# Patient Record
Sex: Female | Born: 1950 | Race: White | Hispanic: No | Smoking: Never smoker
Health system: Southern US, Community
[De-identification: ages and names within clinical notes are randomized; demographics above are authoritative.]

## PROBLEM LIST (undated history)

## (undated) DIAGNOSIS — C801 Malignant (primary) neoplasm, unspecified: Secondary | ICD-10-CM

## (undated) DIAGNOSIS — K219 Gastro-esophageal reflux disease without esophagitis: Secondary | ICD-10-CM

## (undated) DIAGNOSIS — G250 Essential tremor: Secondary | ICD-10-CM

## (undated) DIAGNOSIS — R42 Dizziness and giddiness: Secondary | ICD-10-CM

## (undated) DIAGNOSIS — M1990 Unspecified osteoarthritis, unspecified site: Secondary | ICD-10-CM

## (undated) HISTORY — PX: TOTAL KNEE ARTHROPLASTY: SHX125

## (undated) HISTORY — PX: TUBAL LIGATION: SHX77

## (undated) HISTORY — PX: KIDNEY SURGERY: SHX687

## (undated) HISTORY — PX: TONSILLECTOMY AND ADENOIDECTOMY: SUR1326

## (undated) HISTORY — PX: HERNIA REPAIR: SHX51

## (undated) HISTORY — PX: ANKLE SURGERY: SHX546

## (undated) HISTORY — PX: APPENDECTOMY: SHX54

---

## 2008-02-29 ENCOUNTER — Ambulatory Visit: Payer: Self-pay

## 2010-01-01 ENCOUNTER — Ambulatory Visit: Payer: Self-pay | Admitting: Family Medicine

## 2011-03-25 ENCOUNTER — Ambulatory Visit: Payer: Self-pay | Admitting: Family Medicine

## 2011-05-11 ENCOUNTER — Ambulatory Visit: Payer: Self-pay | Admitting: Family Medicine

## 2012-03-14 ENCOUNTER — Ambulatory Visit: Payer: Self-pay | Admitting: Family Medicine

## 2012-06-08 ENCOUNTER — Ambulatory Visit: Payer: Self-pay | Admitting: Family Medicine

## 2013-02-23 ENCOUNTER — Ambulatory Visit: Payer: Self-pay | Admitting: Family Medicine

## 2013-08-01 ENCOUNTER — Ambulatory Visit: Payer: Self-pay | Admitting: Family Medicine

## 2013-08-15 ENCOUNTER — Ambulatory Visit: Payer: Self-pay | Admitting: Family Medicine

## 2014-01-20 ENCOUNTER — Emergency Department: Payer: Self-pay | Admitting: Emergency Medicine

## 2014-01-20 LAB — CBC WITH DIFFERENTIAL/PLATELET
BASOS ABS: 0.1 10*3/uL (ref 0.0–0.1)
BASOS PCT: 1.2 %
Eosinophil #: 0.4 10*3/uL (ref 0.0–0.7)
Eosinophil %: 4.5 %
HCT: 36.8 % (ref 35.0–47.0)
HGB: 12.2 g/dL (ref 12.0–16.0)
LYMPHS ABS: 2.7 10*3/uL (ref 1.0–3.6)
LYMPHS PCT: 31.5 %
MCH: 28.3 pg (ref 26.0–34.0)
MCHC: 33.1 g/dL (ref 32.0–36.0)
MCV: 85 fL (ref 80–100)
Monocyte #: 0.7 x10 3/mm (ref 0.2–0.9)
Monocyte %: 7.9 %
NEUTROS ABS: 4.7 10*3/uL (ref 1.4–6.5)
Neutrophil %: 54.9 %
Platelet: 274 10*3/uL (ref 150–440)
RBC: 4.31 10*6/uL (ref 3.80–5.20)
RDW: 14.4 % (ref 11.5–14.5)
WBC: 8.6 10*3/uL (ref 3.6–11.0)

## 2014-01-21 LAB — BASIC METABOLIC PANEL
ANION GAP: 7 (ref 7–16)
BUN: 17 mg/dL (ref 7–18)
CREATININE: 1.24 mg/dL (ref 0.60–1.30)
Calcium, Total: 9.2 mg/dL (ref 8.5–10.1)
Chloride: 105 mmol/L (ref 98–107)
Co2: 27 mmol/L (ref 21–32)
EGFR (African American): 54 — ABNORMAL LOW
EGFR (Non-African Amer.): 46 — ABNORMAL LOW
GLUCOSE: 102 mg/dL — AB (ref 65–99)
OSMOLALITY: 279 (ref 275–301)
POTASSIUM: 3.3 mmol/L — AB (ref 3.5–5.1)
Sodium: 139 mmol/L (ref 136–145)

## 2014-01-25 LAB — CULTURE, BLOOD (SINGLE)

## 2014-03-20 ENCOUNTER — Ambulatory Visit: Payer: Self-pay | Admitting: Family Medicine

## 2014-03-27 ENCOUNTER — Ambulatory Visit: Payer: Self-pay | Admitting: Family Medicine

## 2014-11-28 LAB — BASIC METABOLIC PANEL
BUN: 17 mg/dL (ref 4–21)
CREATININE: 1 mg/dL (ref 0.5–1.1)
Glucose: 102 mg/dL
POTASSIUM: 4.4 mmol/L (ref 3.4–5.3)
Sodium: 140 mmol/L (ref 137–147)

## 2014-11-28 LAB — CBC AND DIFFERENTIAL
HCT: 41 % (ref 36–46)
HEMOGLOBIN: 13.7 g/dL (ref 12.0–16.0)
Neutrophils Absolute: 4 /uL
Platelets: 338 10*3/uL (ref 150–399)
WBC: 7 10*3/mL

## 2014-11-28 LAB — HEPATIC FUNCTION PANEL
ALK PHOS: 97 U/L (ref 25–125)
ALT: 21 U/L (ref 7–35)
AST: 30 U/L (ref 13–35)
Bilirubin, Total: 0.3 mg/dL

## 2014-11-28 LAB — HM HEPATITIS C SCREENING LAB: HM Hepatitis Screen: NEGATIVE

## 2014-11-28 LAB — LIPID PANEL
Cholesterol: 213 mg/dL — AB (ref 0–200)
HDL: 69 mg/dL (ref 35–70)
LDL Cholesterol: 116 mg/dL
LDl/HDL Ratio: 1.7
Triglycerides: 140 mg/dL (ref 40–160)

## 2014-11-28 LAB — TSH: TSH: 1.18 u[IU]/mL (ref 0.41–5.90)

## 2015-03-30 ENCOUNTER — Other Ambulatory Visit: Payer: Self-pay | Admitting: Family Medicine

## 2015-04-15 ENCOUNTER — Other Ambulatory Visit: Payer: Self-pay | Admitting: Family Medicine

## 2015-05-13 ENCOUNTER — Telehealth: Payer: Self-pay | Admitting: Family Medicine

## 2015-05-13 DIAGNOSIS — R922 Inconclusive mammogram: Secondary | ICD-10-CM

## 2015-05-13 DIAGNOSIS — Z1239 Encounter for other screening for malignant neoplasm of breast: Secondary | ICD-10-CM

## 2015-05-13 NOTE — Telephone Encounter (Signed)
Pt tried to schedule her mammagram at [facility] and they told her her primary care doctor would have to order it.  Can we make her an appt and call her back?  Her call back is  cell [phone]  Thanks Con Memos

## 2015-05-16 ENCOUNTER — Telehealth: Payer: Self-pay | Admitting: Family Medicine

## 2015-05-16 DIAGNOSIS — R928 Other abnormal and inconclusive findings on diagnostic imaging of breast: Secondary | ICD-10-CM

## 2015-05-16 NOTE — Telephone Encounter (Signed)
Done-aa 

## 2015-05-16 NOTE — Telephone Encounter (Signed)
Per [facility] pt needs diagnostic bilateral mammogram along with right/left breast ultrasound.It was put in as a uni R,Thanks

## 2015-05-27 ENCOUNTER — Other Ambulatory Visit: Payer: Self-pay | Admitting: Family Medicine

## 2015-05-27 ENCOUNTER — Ambulatory Visit: Payer: Self-pay

## 2015-05-27 ENCOUNTER — Ambulatory Visit
Admission: RE | Admit: 2015-05-27 | Discharge: 2015-05-27 | Disposition: A | Discharge status: Home / Self Care | Payer: PRIVATE HEALTH INSURANCE | Source: Ambulatory Visit | Attending: Family Medicine | Admitting: Family Medicine

## 2015-05-27 ENCOUNTER — Ambulatory Visit: Admission: RE | Admit: 2015-05-27 | Payer: Self-pay | Source: Ambulatory Visit

## 2015-05-27 DIAGNOSIS — R928 Other abnormal and inconclusive findings on diagnostic imaging of breast: Secondary | ICD-10-CM

## 2015-05-27 DIAGNOSIS — R921 Mammographic calcification found on diagnostic imaging of breast: Secondary | ICD-10-CM

## 2015-05-27 HISTORY — DX: Malignant (primary) neoplasm, unspecified: C80.1

## 2015-05-28 ENCOUNTER — Encounter: Payer: Self-pay | Admitting: Family Medicine

## 2015-06-04 ENCOUNTER — Ambulatory Visit
Admission: RE | Admit: 2015-06-04 | Discharge: 2015-06-04 | Disposition: A | Discharge status: Home / Self Care | Payer: PRIVATE HEALTH INSURANCE | Source: Ambulatory Visit | Attending: Family Medicine | Admitting: Family Medicine

## 2015-06-04 DIAGNOSIS — R921 Mammographic calcification found on diagnostic imaging of breast: Secondary | ICD-10-CM | POA: Insufficient documentation

## 2015-06-04 HISTORY — PX: BREAST BIOPSY: SHX20

## 2015-06-05 LAB — SURGICAL PATHOLOGY

## 2015-08-29 NOTE — Addendum Note (Signed)
Encounter addended by: Wayne Both on: 08/29/2015  1:32 PM<BR>     Documentation filed: Charges VN

## 2015-12-26 DIAGNOSIS — K219 Gastro-esophageal reflux disease without esophagitis: Secondary | ICD-10-CM | POA: Insufficient documentation

## 2015-12-26 DIAGNOSIS — C649 Malignant neoplasm of unspecified kidney, except renal pelvis: Secondary | ICD-10-CM | POA: Insufficient documentation

## 2015-12-26 DIAGNOSIS — N289 Disorder of kidney and ureter, unspecified: Secondary | ICD-10-CM | POA: Insufficient documentation

## 2015-12-26 DIAGNOSIS — I10 Essential (primary) hypertension: Secondary | ICD-10-CM | POA: Insufficient documentation

## 2015-12-26 DIAGNOSIS — G4700 Insomnia, unspecified: Secondary | ICD-10-CM | POA: Insufficient documentation

## 2015-12-26 DIAGNOSIS — L720 Epidermal cyst: Secondary | ICD-10-CM | POA: Insufficient documentation

## 2015-12-26 DIAGNOSIS — T148XXA Other injury of unspecified body region, initial encounter: Secondary | ICD-10-CM | POA: Insufficient documentation

## 2015-12-26 DIAGNOSIS — I459 Conduction disorder, unspecified: Secondary | ICD-10-CM | POA: Insufficient documentation

## 2015-12-26 DIAGNOSIS — E669 Obesity, unspecified: Secondary | ICD-10-CM | POA: Insufficient documentation

## 2015-12-31 ENCOUNTER — Ambulatory Visit (INDEPENDENT_AMBULATORY_CARE_PROVIDER_SITE_OTHER): Payer: 59 | Admitting: Family Medicine

## 2015-12-31 ENCOUNTER — Encounter: Payer: Self-pay | Admitting: Family Medicine

## 2015-12-31 VITALS — BP 160/88 | HR 68 | Temp 98.1°F | Resp 16 | Ht 69.0 in | Wt 229.0 lb

## 2015-12-31 DIAGNOSIS — IMO0001 Reserved for inherently not codable concepts without codable children: Secondary | ICD-10-CM

## 2015-12-31 DIAGNOSIS — R030 Elevated blood-pressure reading, without diagnosis of hypertension: Secondary | ICD-10-CM

## 2015-12-31 DIAGNOSIS — Z0000 Encounter for general adult medical examination without abnormal findings: Secondary | ICD-10-CM

## 2015-12-31 DIAGNOSIS — Z23 Encounter for immunization: Secondary | ICD-10-CM

## 2015-12-31 DIAGNOSIS — Z1211 Encounter for screening for malignant neoplasm of colon: Secondary | ICD-10-CM

## 2015-12-31 LAB — POCT URINALYSIS DIPSTICK
Bilirubin, UA: NEGATIVE
Glucose, UA: NEGATIVE
KETONES UA: NEGATIVE
Leukocytes, UA: NEGATIVE
Nitrite, UA: NEGATIVE
PH UA: 6
PROTEIN UA: NEGATIVE
RBC UA: NEGATIVE
SPEC GRAV UA: 1.01
UROBILINOGEN UA: NEGATIVE

## 2015-12-31 NOTE — Progress Notes (Signed)
Patient ID: Jaime Ramirez, female   DOB: [birth year], 65 y.o.   MRN: [id number] Patient: Jaime Ramirez, Female    DOB: [birth year], 65 y.o.   MRN: [id number] Visit Date: 12/31/2015  Today's Provider: [name], MD   Chief Complaint  Patient presents with  . Annual Exam   Subjective:  Jaime Ramirez is a 65 y.o. female who presents today for health maintenance and complete physical. She feels well. She reports exercising 5 days weekly. She reports she is sleeping well.   Colonoscopy-patient states she has had 2 since the age of 66 but none in the last 10 years.  Patient would like to have this scheduled before the end of June.  06/07/15 Mammogram 06/29/12 Pap smear  Immunization History  Administered Date(s) Administered  . Tdap 03/20/2014  . Zoster 11/28/2014     Review of Systems  Constitutional: Negative.   HENT: Negative.   Eyes: Negative.   Respiratory: Negative.   Cardiovascular: Negative.   Gastrointestinal: Negative.   Endocrine: Negative.   Genitourinary: Negative.   Musculoskeletal: Positive for arthralgias.  Skin: Negative.   Allergic/Immunologic: Negative.   Neurological: Positive for tremors.  Hematological: Negative.   Psychiatric/Behavioral: Negative.     Social History   Social History  . Marital Status: Divorced    Spouse Name: N/A  . Number of Children: N/A  . Years of Education: N/A   Occupational History  . Not on file.   Social History Main Topics  . Smoking status: Never Smoker   . Smokeless tobacco: Not on file  . Alcohol Use: 0.0 oz/week    0 Standard drinks or equivalent per week     Comment: 2 per week  . Drug Use: No  . Sexual Activity: Not on file   Other Topics Concern  . Not on file   Social History Narrative    Patient Active Problem List   Diagnosis Date Noted  . 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 Acid reflux 12/26/2015  . BP (high blood pressure) 12/26/2015  . Cannot sleep 12/26/2015  . Hematoma  12/26/2015  . Disorder of kidney and ureter 12/26/2015  . Adiposity 12/26/2015  . Adenocarcinoma, renal cell ([place]) 12/26/2015  . Atheroma, skin 12/26/2015    Past Surgical History  Procedure Laterality Date  . Hernia repair    . Kidney surgery      kidney removal  . Ankle surgery    . Tubal ligation    . Appendectomy    . Tonsillectomy and adenoidectomy    . Total knee arthroplasty      Her family history includes Atrial fibrillation in her mother; Brain cancer in her paternal aunt; Breast cancer in her maternal aunt and paternal aunt; Cancer in her maternal grandfather; Heart disease in her mother and sister; Hypertension in her father; Kidney disease in her father; Lupus in her sister; Stroke in her maternal grandmother and sister.    Outpatient Prescriptions Prior to Visit  Medication Sig Dispense Refill  . aspirin 81 MG tablet Take by mouth.    . hydrochlorothiazide (HYDRODIURIL) 25 MG tablet TAKE 1 TABLET BY MOUTH EVERY MORNING 30 tablet 12  . Naproxen Sod-Diphenhydramine (ALEVE PM) 220-25 MG TABS Take by mouth.    . pantoprazole (PROTONIX) 40 MG tablet TAKE 1 TABLET BY MOUTH TWICE DAILY 60 tablet 12  . ranitidine (ZANTAC) 300 MG tablet TAKE 1 TABLET BY MOUTH EVERY DAY 30 tablet 12   No facility-administered medications prior to visit.  Patient Care Team: Jerrol Banana., MD as PCP - General (Family Medicine)     Objective:   Vitals:  Filed Vitals:   12/31/15 1040  BP: 160/88  Pulse: 68  Temp: 98.1 F (36.7 C)  TempSrc: Oral  Resp: 16  Height: 5\' 9"  (1.753 m)  Weight: 229 lb (103.874 kg)    Physical Exam  Constitutional: She is oriented to person, place, and time. She appears well-developed and well-nourished.  HENT:  Head: Normocephalic and atraumatic.  Right Ear: External ear normal.  Left Ear: External ear normal.  Nose: Nose normal.  Mouth/Throat: Oropharynx is clear and moist.  Eyes: Conjunctivae and EOM are normal. Pupils are equal, round,  and reactive to light.  Neck: Normal range of motion. Neck supple.  Cardiovascular: Normal rate, regular rhythm, normal heart sounds and intact distal pulses.   Pulmonary/Chest: Effort normal and breath sounds normal.  Abdominal: Soft. Bowel sounds are normal.  Musculoskeletal: Normal range of motion.  Neurological: She is alert and oriented to person, place, and time.  Skin: Skin is warm and dry.  Psychiatric: She has a normal mood and affect. Her behavior is normal. Judgment and thought content normal.     Depression Screen PHQ 2/9 Scores 12/31/2015  PHQ - 2 Score 0      Assessment & Plan:     Routine Health Maintenance and Physical Exam  1. Annual physical exam  - CBC with Differential/Platelet - Comprehensive metabolic panel - Lipid Panel With LDL/HDL Ratio - TSH - POCT urinalysis dipstick  2. Need for pneumococcal vaccine  - Pneumococcal conjugate vaccine 13-valent IM  3. Colon cancer screening  - Ambulatory referral to Gastroenterology  4. Elevated BP Elevated reading in the office today.  She states it has been elevated 2 weeks ago when she donated blood.  Will allow her to work on habits.  Have discussed starting her on something if it is still elevated on follow up visit.   Exercise Activities and Dietary recommendations Goals    None      Immunization History  Administered Date(s) Administered  . Tdap 03/20/2014  . Zoster 11/28/2014    Health Maintenance  Topic Date Due  . Hepatitis C Screening  May 17, 1951  . HIV Screening  12/18/1965  . PAP SMEAR  12/19/1971  . COLONOSCOPY  12/18/2000  . DEXA SCAN  12/19/2015  . PNA vac Low Risk Adult (1 of 2 - PCV13) 12/19/2015  . INFLUENZA VACCINE  03/24/2016  . MAMMOGRAM  06/03/2017  . TETANUS/TDAP  03/20/2024  . ZOSTAVAX  Completed      Discussed health benefits of physical activity, and encouraged her to engage in regular exercise appropriate for her age and condition.   I have done the exam and  reviewed the above chart and it is accurate to the best of my knowledge.  ------------------------------------------------------------------------------------------------------------

## 2016-01-01 LAB — CBC WITH DIFFERENTIAL/PLATELET
BASOS ABS: 0.1 10*3/uL (ref 0.0–0.2)
Basos: 1 %
EOS (ABSOLUTE): 0.2 10*3/uL (ref 0.0–0.4)
Eos: 4 %
HEMOGLOBIN: 12.5 g/dL (ref 11.1–15.9)
Hematocrit: 38 % (ref 34.0–46.6)
Immature Grans (Abs): 0 10*3/uL (ref 0.0–0.1)
Immature Granulocytes: 0 %
LYMPHS ABS: 1.9 10*3/uL (ref 0.7–3.1)
Lymphs: 28 %
MCH: 28.2 pg (ref 26.6–33.0)
MCHC: 32.9 g/dL (ref 31.5–35.7)
MCV: 86 fL (ref 79–97)
MONOS ABS: 0.6 10*3/uL (ref 0.1–0.9)
Monocytes: 9 %
NEUTROS ABS: 4 10*3/uL (ref 1.4–7.0)
Neutrophils: 58 %
PLATELETS: 315 10*3/uL (ref 150–379)
RBC: 4.44 x10E6/uL (ref 3.77–5.28)
RDW: 14.4 % (ref 12.3–15.4)
WBC: 6.8 10*3/uL (ref 3.4–10.8)

## 2016-01-01 LAB — LIPID PANEL WITH LDL/HDL RATIO
CHOLESTEROL TOTAL: 183 mg/dL (ref 100–199)
HDL: 69 mg/dL (ref >39)
LDL Calculated: 91 mg/dL (ref 0–99)
LDl/HDL Ratio: 1.3 ratio units (ref 0.0–3.2)
Triglycerides: 115 mg/dL (ref 0–149)
VLDL CHOLESTEROL CAL: 23 mg/dL (ref 5–40)

## 2016-01-01 LAB — COMPREHENSIVE METABOLIC PANEL
ALBUMIN: 4.2 g/dL (ref 3.6–4.8)
ALK PHOS: 93 IU/L (ref 39–117)
ALT: 12 IU/L (ref 0–32)
AST: 21 IU/L (ref 0–40)
Albumin/Globulin Ratio: 1.5 (ref 1.2–2.2)
BILIRUBIN TOTAL: 0.3 mg/dL (ref 0.0–1.2)
BUN / CREAT RATIO: 14 (ref 12–28)
BUN: 15 mg/dL (ref 8–27)
CHLORIDE: 101 mmol/L (ref 96–106)
CO2: 22 mmol/L (ref 18–29)
Calcium: 9.6 mg/dL (ref 8.7–10.3)
Creatinine, Ser: 1.04 mg/dL — ABNORMAL HIGH (ref 0.57–1.00)
GFR calc Af Amer: 65 mL/min/{1.73_m2} (ref >59)
GFR calc non Af Amer: 57 mL/min/{1.73_m2} — ABNORMAL LOW (ref >59)
GLUCOSE: 94 mg/dL (ref 65–99)
Globulin, Total: 2.8 g/dL (ref 1.5–4.5)
Potassium: 4.1 mmol/L (ref 3.5–5.2)
Sodium: 143 mmol/L (ref 134–144)
Total Protein: 7 g/dL (ref 6.0–8.5)

## 2016-01-01 LAB — TSH: TSH: 1.01 u[IU]/mL (ref 0.450–4.500)

## 2016-01-15 ENCOUNTER — Other Ambulatory Visit: Payer: Self-pay

## 2016-01-15 ENCOUNTER — Telehealth: Payer: Self-pay

## 2016-01-15 NOTE — Telephone Encounter (Signed)
Gastroenterology Pre-Procedure Review  Request Date: 02/21/16 Requesting Physician: Dr. Rosanna Randy  PATIENT REVIEW QUESTIONS: The patient responded to the following health history questions as indicated:    1. Are you having any GI issues? no 2. Do you have a personal history of Polyps? no 3. Do you have a family history of Colon Cancer or Polyps? no 4. Diabetes Mellitus? no 5. Joint replacements in the past 12 months?no 6. Major health problems in the past 3 months?no 7. Any artificial heart valves, MVP, or defibrillator?no    MEDICATIONS & ALLERGIES:    Patient reports the following regarding taking any anticoagulation/antiplatelet therapy:   Plavix, Coumadin, Eliquis, Xarelto, Lovenox, Pradaxa, Brilinta, or Effient? no Aspirin? yes (ASA 81mg )  Patient confirms/reports the following medications:  Current Outpatient Prescriptions  Medication Sig Dispense Refill  . aspirin 81 MG tablet Take by mouth.    . hydrochlorothiazide (HYDRODIURIL) 25 MG tablet TAKE 1 TABLET BY MOUTH EVERY MORNING 30 tablet 12  . Naproxen Sod-Diphenhydramine (ALEVE PM) 220-25 MG TABS Take by mouth.    . pantoprazole (PROTONIX) 40 MG tablet TAKE 1 TABLET BY MOUTH TWICE DAILY 60 tablet 12  . ranitidine (ZANTAC) 300 MG tablet TAKE 1 TABLET BY MOUTH EVERY DAY 30 tablet 12   No current facility-administered medications for this visit.    Patient confirms/reports the following allergies:  Allergies  Allergen Reactions  . Codeine   . Hydromorphone Hives    No orders of the defined types were placed in this encounter.    AUTHORIZATION INFORMATION Primary Insurance: 1D#: Group #:  Secondary Insurance: 1D#: Group #:  SCHEDULE INFORMATION: Date: 02/21/16 Time: Location: [place]

## 2016-02-19 NOTE — Discharge Instructions (Signed)

## 2016-02-21 ENCOUNTER — Ambulatory Visit: Payer: PRIVATE HEALTH INSURANCE | Admitting: Anesthesiology

## 2016-02-21 ENCOUNTER — Encounter
Admission: RE | Disposition: A | Discharge status: Home / Self Care | Payer: Self-pay | Source: Ambulatory Visit | Attending: Gastroenterology

## 2016-02-21 ENCOUNTER — Ambulatory Visit
Admission: RE | Admit: 2016-02-21 | Discharge: 2016-02-21 | Disposition: A | Discharge status: Home / Self Care | Payer: PRIVATE HEALTH INSURANCE | Source: Ambulatory Visit | Attending: Gastroenterology | Admitting: Gastroenterology

## 2016-02-21 DIAGNOSIS — K219 Gastro-esophageal reflux disease without esophagitis: Secondary | ICD-10-CM | POA: Insufficient documentation

## 2016-02-21 DIAGNOSIS — K633 Ulcer of intestine: Secondary | ICD-10-CM | POA: Diagnosis not present

## 2016-02-21 DIAGNOSIS — Z808 Family history of malignant neoplasm of other organs or systems: Secondary | ICD-10-CM | POA: Diagnosis not present

## 2016-02-21 DIAGNOSIS — Z803 Family history of malignant neoplasm of breast: Secondary | ICD-10-CM | POA: Insufficient documentation

## 2016-02-21 DIAGNOSIS — Z7982 Long term (current) use of aspirin: Secondary | ICD-10-CM | POA: Insufficient documentation

## 2016-02-21 DIAGNOSIS — Z809 Family history of malignant neoplasm, unspecified: Secondary | ICD-10-CM | POA: Insufficient documentation

## 2016-02-21 DIAGNOSIS — Z79899 Other long term (current) drug therapy: Secondary | ICD-10-CM | POA: Insufficient documentation

## 2016-02-21 DIAGNOSIS — Z85528 Personal history of other malignant neoplasm of kidney: Secondary | ICD-10-CM | POA: Diagnosis not present

## 2016-02-21 DIAGNOSIS — K5730 Diverticulosis of large intestine without perforation or abscess without bleeding: Secondary | ICD-10-CM | POA: Insufficient documentation

## 2016-02-21 DIAGNOSIS — M1990 Unspecified osteoarthritis, unspecified site: Secondary | ICD-10-CM | POA: Diagnosis not present

## 2016-02-21 DIAGNOSIS — Z840 Family history of diseases of the skin and subcutaneous tissue: Secondary | ICD-10-CM | POA: Diagnosis not present

## 2016-02-21 DIAGNOSIS — Z888 Allergy status to other drugs, medicaments and biological substances status: Secondary | ICD-10-CM | POA: Insufficient documentation

## 2016-02-21 DIAGNOSIS — K641 Second degree hemorrhoids: Secondary | ICD-10-CM | POA: Diagnosis not present

## 2016-02-21 DIAGNOSIS — Z1211 Encounter for screening for malignant neoplasm of colon: Secondary | ICD-10-CM | POA: Diagnosis not present

## 2016-02-21 DIAGNOSIS — Z8249 Family history of ischemic heart disease and other diseases of the circulatory system: Secondary | ICD-10-CM | POA: Diagnosis not present

## 2016-02-21 DIAGNOSIS — Z96653 Presence of artificial knee joint, bilateral: Secondary | ICD-10-CM | POA: Diagnosis not present

## 2016-02-21 DIAGNOSIS — Z885 Allergy status to narcotic agent status: Secondary | ICD-10-CM | POA: Insufficient documentation

## 2016-02-21 DIAGNOSIS — K529 Noninfective gastroenteritis and colitis, unspecified: Secondary | ICD-10-CM | POA: Insufficient documentation

## 2016-02-21 DIAGNOSIS — Z823 Family history of stroke: Secondary | ICD-10-CM | POA: Diagnosis not present

## 2016-02-21 HISTORY — DX: Unspecified osteoarthritis, unspecified site: M19.90

## 2016-02-21 HISTORY — PX: COLONOSCOPY WITH PROPOFOL: SHX5780

## 2016-02-21 HISTORY — DX: Gastro-esophageal reflux disease without esophagitis: K21.9

## 2016-02-21 SURGERY — COLONOSCOPY WITH PROPOFOL
Anesthesia: Monitor Anesthesia Care | Wound class: Contaminated

## 2016-02-21 MED ORDER — STERILE WATER FOR IRRIGATION IR SOLN
Status: DC | PRN
  Administered 2016-02-21: 09:00:00

## 2016-02-21 MED ORDER — LACTATED RINGERS IV SOLN
INTRAVENOUS | Status: DC
  Administered 2016-02-21: 08:00:00 via INTRAVENOUS

## 2016-02-21 MED ORDER — LIDOCAINE HCL (CARDIAC) 20 MG/ML IV SOLN
INTRAVENOUS | Status: DC | PRN
  Administered 2016-02-21: 40 mg via INTRAVENOUS

## 2016-02-21 MED ORDER — ACETAMINOPHEN 160 MG/5ML PO SOLN: 325.0000 mg | ORAL | Status: DC | PRN

## 2016-02-21 MED ORDER — PROPOFOL 10 MG/ML IV BOLUS
INTRAVENOUS | Status: DC | PRN
  Administered 2016-02-21 (×3): 50 mg via INTRAVENOUS
  Administered 2016-02-21: 40 mg via INTRAVENOUS

## 2016-02-21 MED ORDER — ACETAMINOPHEN 325 MG PO TABS: 325.0000 mg | ORAL_TABLET | ORAL | Status: DC | PRN

## 2016-02-21 SURGICAL SUPPLY — 23 items
CANISTER SUCT 1200ML W/VALVE (MISCELLANEOUS) ×2 IMPLANT
CLIP HMST 235XBRD CATH ROT (MISCELLANEOUS) IMPLANT
CLIP RESOLUTION 360 11X235 (MISCELLANEOUS)
FCP ESCP3.2XJMB 240X2.8X (MISCELLANEOUS)
FORCEPS BIOP RAD 4 LRG CAP 4 (CUTTING FORCEPS) ×2 IMPLANT
FORCEPS BIOP RJ4 240 W/NDL (MISCELLANEOUS)
FORCEPS ESCP3.2XJMB 240X2.8X (MISCELLANEOUS) IMPLANT
GOWN CVR UNV OPN BCK APRN NK (MISCELLANEOUS) ×2 IMPLANT
GOWN ISOL THUMB LOOP REG UNIV (MISCELLANEOUS) ×2
INJECTOR VARIJECT VIN23 (MISCELLANEOUS) IMPLANT
KIT DEFENDO VALVE AND CONN (KITS) IMPLANT
KIT ENDO PROCEDURE OLY (KITS) ×2 IMPLANT
MARKER SPOT ENDO TATTOO 5ML (MISCELLANEOUS) IMPLANT
PAD GROUND ADULT SPLIT (MISCELLANEOUS) IMPLANT
PROBE APC STR FIRE (PROBE) IMPLANT
RETRIEVER NET ROTH 2.5X230 LF (MISCELLANEOUS) ×2 IMPLANT
SNARE SHORT THROW 13M SML OVAL (MISCELLANEOUS) IMPLANT
SNARE SHORT THROW 30M LRG OVAL (MISCELLANEOUS) IMPLANT
SNARE SNG USE RND 15MM (INSTRUMENTS) IMPLANT
SPOT EX ENDOSCOPIC TATTOO (MISCELLANEOUS)
TRAP ETRAP POLY (MISCELLANEOUS) IMPLANT
VARIJECT INJECTOR VIN23 (MISCELLANEOUS)
WATER STERILE IRR 250ML POUR (IV SOLUTION) ×2 IMPLANT

## 2016-02-21 NOTE — Transfer of Care (Signed)
Immediate Anesthesia Transfer of Care Note  Patient: Jaime Ramirez  Procedure(s) Performed: Procedure(s): COLONOSCOPY WITH PROPOFOL (N/A)  Patient Location: PACU  Anesthesia Type: MAC  Level of Consciousness: awake, alert  and patient cooperative  Airway and Oxygen Therapy: Patient Spontanous Breathing and Patient connected to supplemental oxygen  Post-op Assessment: Post-op Vital signs reviewed, Patient's Cardiovascular Status Stable, Respiratory Function Stable, Patent Airway and No signs of Nausea or vomiting  Post-op Vital Signs: Reviewed and stable  Complications: No apparent anesthesia complications

## 2016-02-21 NOTE — Anesthesia Postprocedure Evaluation (Signed)
Anesthesia Post Note  Patient: Jaime Ramirez  Procedure(s) Performed: Procedure(s) (LRB): COLONOSCOPY WITH PROPOFOL (N/A)  Patient location during evaluation: PACU Anesthesia Type: MAC Level of consciousness: awake and alert and oriented Pain management: satisfactory to patient Vital Signs Assessment: post-procedure vital signs reviewed and stable Respiratory status: spontaneous breathing, nonlabored ventilation and respiratory function stable Cardiovascular status: blood pressure returned to baseline and stable Postop Assessment: Adequate PO intake and No signs of nausea or vomiting Anesthetic complications: no    Raliegh Ip

## 2016-02-21 NOTE — Op Note (Signed)
[facility] Gastroenterology Patient Name: Jaime Ramirez Procedure Date: 02/21/2016 8:59 AM MRN: [id number] Account #: [id number] Date of Birth: 08/11/1951 Admit Type: Outpatient Age: 65 Room: [facility] OR ROOM 01 Gender: Female Note Status: Finalized Procedure:            Colonoscopy Indications:          Screening for colorectal malignant neoplasm Providers:            Lucilla Lame, MD Referring MD:         Janine Ores. Rosanna Randy, MD (Referring MD) Medicines:            Propofol per Anesthesia Complications:        No immediate complications. Procedure:            Pre-Anesthesia Assessment:                       - Prior to the procedure, a History and Physical was                        performed, and patient medications and allergies were                        reviewed. The patient's tolerance of previous                        anesthesia was also reviewed. The risks and benefits of                        the procedure and the sedation options and risks were                        discussed with the patient. All questions were                        answered, and informed consent was obtained. Prior                        Anticoagulants: The patient has taken no previous                        anticoagulant or antiplatelet agents. ASA Grade                        Assessment: II - A patient with mild systemic disease.                        After reviewing the risks and benefits, the patient was                        deemed in satisfactory condition to undergo the                        procedure.                       After obtaining informed consent, the colonoscope was                        passed under direct vision. Throughout the procedure,  the patient's blood pressure, pulse, and oxygen                        saturations were monitored continuously. The Olympus CF                        H180AL colonoscope (S#: [id number]) was introduced  through                        the anus and advanced to the the cecum, identified by                        appendiceal orifice and ileocecal valve. The                        colonoscopy was performed without difficulty. The                        patient tolerated the procedure well. The quality of                        the bowel preparation was excellent. Findings:      The perianal and digital rectal examinations were normal.      A single small-mouthed diverticulum was found in the sigmoid colon.      Non-bleeding internal hemorrhoids were found during retroflexion. The       hemorrhoids were Grade II (internal hemorrhoids that prolapse but reduce       spontaneously).      Nonbleeding ulcerated mucosa were present in the sigmoid colon. Biopsies       were taken with a cold forceps for histology. Impression:           - Diverticulosis in the sigmoid colon.                       - Non-bleeding internal hemorrhoids.                       - Mucosal ulceration. Biopsied. Recommendation:       - Await pathology results. Procedure Code(s):    --- Professional ---                       [phone], Colonoscopy, flexible; with biopsy, single or                        multiple Diagnosis Code(s):    --- Professional ---                       Z12.11, Encounter for screening for malignant neoplasm                        of colon                       K63.3, Ulcer of intestine CPT copyright 2016 American Medical Association. All rights reserved. The codes documented in this report are preliminary and upon coder review may  be revised to meet current compliance requirements. Lucilla Lame, MD 02/21/2016 9:21:56 AM This report has been signed electronically. Number of Addenda: 0 Note Initiated On: 02/21/2016 8:59 AM Scope Withdrawal Time: 0 hours 6 minutes  18 seconds  Total Procedure Duration: 0 hours 10 minutes 35 seconds       [facility]

## 2016-02-21 NOTE — Anesthesia Preprocedure Evaluation (Signed)
Anesthesia Evaluation  Patient identified by MRN, date of birth, ID band  Reviewed: Allergy & Precautions, H&P , NPO status , Patient's Chart, lab work & pertinent test results  Airway Mallampati: II  TM Distance: >3 FB Neck ROM: full    Dental no notable dental hx.    Pulmonary    Pulmonary exam normal       Cardiovascular hypertension, Rhythm:regular Rate:Normal     Neuro/Psych    GI/Hepatic GERD-  ,  Endo/Other    Renal/GU      Musculoskeletal   Abdominal   Peds  Hematology   Anesthesia Other Findings   Reproductive/Obstetrics                             Anesthesia Physical Anesthesia Plan  ASA: II  Anesthesia Plan: MAC   Post-op Pain Management:    Induction:   Airway Management Planned:   Additional Equipment:   Intra-op Plan:   Post-operative Plan:   Informed Consent: I have reviewed the patients History and Physical, chart, labs and discussed the procedure including the risks, benefits and alternatives for the proposed anesthesia with the patient or authorized representative who has indicated his/her understanding and acceptance.     Plan Discussed with: CRNA  Anesthesia Plan Comments:         Anesthesia Quick Evaluation  

## 2016-02-21 NOTE — H&P (Signed)
Lucilla Lame, MD [address] [place], [place] [address] Phone: [phone] Fax : [phone]  Primary Care Physician:  [name], MD Primary Gastroenterologist:  Dr. Allen Norris  Pre-Procedure History & Physical: HPI:  Jaime Ramirez is a 65 y.o. female is here for a screening colonoscopy.   Past Medical History  Diagnosis Date  . Cancer ([place])     kidney  . Arthritis   . GERD (gastroesophageal reflux disease)     Past Surgical History  Procedure Laterality Date  . Hernia repair    . Kidney surgery Left     kidney removal  . Ankle surgery    . Tubal ligation    . Appendectomy    . Tonsillectomy and adenoidectomy    . Total knee arthroplasty Bilateral     Prior to Admission medications   Medication Sig Start Date End Date Taking? Authorizing Provider  aspirin 81 MG tablet Take by mouth.   Yes Historical Provider, MD  hydrochlorothiazide (HYDRODIURIL) 25 MG tablet TAKE 1 TABLET BY MOUTH EVERY MORNING 04/01/15  Yes Jerrol Banana., MD  Naproxen Sod-Diphenhydramine (ALEVE PM) 220-25 MG TABS Take by mouth.   Yes Historical Provider, MD  pantoprazole (PROTONIX) 40 MG tablet TAKE 1 TABLET BY MOUTH TWICE DAILY 04/01/15  Yes Jerrol Banana., MD  ranitidine (ZANTAC) 300 MG tablet TAKE 1 TABLET BY MOUTH EVERY DAY 04/16/15  Yes Jerrol Banana., MD    Allergies as of 01/15/2016 - Review Complete 01/15/2016  Allergen Reaction Noted  . Codeine  12/26/2015  . Hydromorphone Hives 12/26/2015    Family History  Problem Relation Age of Onset  . Breast cancer Maternal Aunt   . Breast cancer Paternal Aunt   . Brain cancer Paternal Aunt   . Atrial fibrillation Mother   . Heart disease Mother     CHF  . Hypertension Father   . Kidney disease Father   . Lupus Sister   . Stroke Sister   . Heart disease Sister   . Stroke Maternal Grandmother   . Cancer Maternal Grandfather     Social History   Social History  . Marital Status: Divorced   Spouse Name: N/A  . Number of Children: N/A  . Years of Education: N/A   Occupational History  . Not on file.   Social History Main Topics  . Smoking status: Never Smoker   . Smokeless tobacco: Not on file  . Alcohol Use: 0.0 oz/week    0 Standard drinks or equivalent per week     Comment: 2 per week  . Drug Use: No  . Sexual Activity: Not on file   Other Topics Concern  . Not on file   Social History Narrative    Review of Systems: See HPI, otherwise negative ROS  Physical Exam: BP 155/104 mmHg  Pulse 99  Temp(Src) 97.5 F (36.4 C) (Temporal)  Resp 16  Ht 5\' 10"  (1.778 m)  Wt 218 lb (98.884 kg)  BMI 31.28 kg/m2  SpO2 97% General:   Alert,  pleasant and cooperative in NAD Head:  Normocephalic and atraumatic. Neck:  Supple; no masses or thyromegaly. Lungs:  Clear throughout to auscultation.    Heart:  Regular rate and rhythm. Abdomen:  Soft, nontender and nondistended. Normal bowel sounds, without guarding, and without rebound.   Neurologic:  Alert and  oriented x4;  grossly normal neurologically.  Impression/Plan: Jaime Ramirez is now here to undergo a screening colonoscopy.  Risks, benefits, and alternatives regarding colonoscopy have been reviewed with the patient.  Questions have been answered.  All parties agreeable.

## 2016-02-24 ENCOUNTER — Encounter: Payer: Self-pay | Admitting: Gastroenterology

## 2016-02-25 ENCOUNTER — Encounter: Payer: Self-pay | Admitting: Gastroenterology

## 2016-04-01 ENCOUNTER — Ambulatory Visit (INDEPENDENT_AMBULATORY_CARE_PROVIDER_SITE_OTHER): Payer: BLUE CROSS/BLUE SHIELD | Admitting: Family Medicine

## 2016-04-01 ENCOUNTER — Encounter: Payer: Self-pay | Admitting: Family Medicine

## 2016-04-01 VITALS — BP 172/90 | HR 84 | Temp 98.1°F | Resp 16 | Wt 225.0 lb

## 2016-04-01 DIAGNOSIS — R601 Generalized edema: Secondary | ICD-10-CM

## 2016-04-01 DIAGNOSIS — K219 Gastro-esophageal reflux disease without esophagitis: Secondary | ICD-10-CM | POA: Diagnosis not present

## 2016-04-01 DIAGNOSIS — R609 Edema, unspecified: Secondary | ICD-10-CM | POA: Insufficient documentation

## 2016-04-01 DIAGNOSIS — I10 Essential (primary) hypertension: Secondary | ICD-10-CM

## 2016-04-01 MED ORDER — PANTOPRAZOLE SODIUM 40 MG PO TBEC: 40.0000 mg | DELAYED_RELEASE_TABLET | Freq: Two times a day (BID) | ORAL | 12 refills | Status: DC

## 2016-04-01 MED ORDER — RANITIDINE HCL 300 MG PO TABS: 300.0000 mg | ORAL_TABLET | Freq: Every day | ORAL | 12 refills | Status: DC

## 2016-04-01 MED ORDER — LOSARTAN POTASSIUM 50 MG PO TABS: 50.0000 mg | ORAL_TABLET | Freq: Every day | ORAL | 3 refills | Status: DC

## 2016-04-01 MED ORDER — HYDROCHLOROTHIAZIDE 25 MG PO TABS: 25.0000 mg | ORAL_TABLET | Freq: Every morning | ORAL | 12 refills | Status: DC

## 2016-04-01 NOTE — Patient Instructions (Signed)
Start Losartan 50 mg once daily, and continue HCTZ. Follow up in 1-2 months for blood pressure check. Keep up the good work!

## 2016-04-01 NOTE — Progress Notes (Signed)
Patient: Jaime Ramirez Female    DOB: 02/04/1951   65 y.o.   MRN: [id number] Visit Date: 04/01/2016  Today's Provider: [name], MD   Chief Complaint  Patient presents with  . Hypertension  . Gastroesophageal Reflux   Subjective:    HPI      Hypertension, follow-up:  BP Readings from Last 3 Encounters:  04/01/16 (!) 172/90  02/21/16 (!) 126/93  12/31/15 (!) 160/88    She was last seen for hypertension 3 months ago.  BP at that visit was 160/88. Management since that visit includes advising pt to work on lifestyle changes. PCP was going to add medication if BP is still elevated at FU. She reports excellent compliance with treatment. She is exercising 5 days per week. She is adherent to low salt diet.   Outside blood pressures are 158/96 with wrist cuff. She is experiencing lower extremity edema.  Patient denies chest pain, chest pressure/discomfort, claudication, dyspnea, exertional chest pressure/discomfort, fatigue, irregular heart beat, near-syncope, orthopnea, palpitations and syncope.   Cardiovascular risk factors include hypertension.      Weight trend: stable Wt Readings from Last 3 Encounters:  04/01/16 225 lb (102.1 kg)  02/21/16 218 lb (98.9 kg)  12/31/15 229 lb (103.9 kg)    Current diet: in general, a "healthy" diet    ------------------------------------------------------------------------   GERD, Follow up:  Last office visit was 12/31/2015. Changes made since that visit include none. Currently taking Protonix 40 mg and Zantac 300 mg.  She reports excellent compliance with treatment. She is not having side effects. Pt is currently not experiencing any GERD sx. ------------------------------------------------------------------------     Allergies  Allergen Reactions  . Codeine   . Hydromorphone Hives   Current Meds  Medication Sig  . aspirin 81 MG tablet Take by mouth.  . hydrochlorothiazide (HYDRODIURIL) 25 MG  tablet TAKE 1 TABLET BY MOUTH EVERY MORNING  . naproxen sodium (ANAPROX) 220 MG tablet Take 440 mg by mouth daily.  . pantoprazole (PROTONIX) 40 MG tablet TAKE 1 TABLET BY MOUTH TWICE DAILY  . ranitidine (ZANTAC) 300 MG tablet TAKE 1 TABLET BY MOUTH EVERY DAY  . [DISCONTINUED] Naproxen Sod-Diphenhydramine (ALEVE PM) 220-25 MG TABS Take by mouth.    Review of Systems  Constitutional: Positive for activity change (is exercisng more) and unexpected weight change. Negative for appetite change, chills, diaphoresis, fatigue and fever.  Eyes: Negative.   Respiratory: Negative for cough, shortness of breath and wheezing.   Cardiovascular: Positive for leg swelling. Negative for chest pain and palpitations.  Gastrointestinal: Negative for abdominal pain and nausea.  Musculoskeletal: Negative.   Allergic/Immunologic: Negative.   Neurological: Negative.   Hematological: Negative.   Psychiatric/Behavioral: Negative.     Social History  Substance Use Topics  . Smoking status: Never Smoker  . Smokeless tobacco: Never Used  . Alcohol use 0.0 oz/week     Comment: 2 per week   Objective:   BP (!) 172/90 (BP Location: Left Arm, Patient Position: Sitting, Cuff Size: Large)   Pulse 84   Temp 98.1 F (36.7 C) (Oral)   Resp 16   Wt 225 lb (102.1 kg)   BMI 32.28 kg/m   Physical Exam  Constitutional: She is oriented to person, place, and time. She appears well-developed and well-nourished.  HENT:  Head: Normocephalic and atraumatic.  Eyes: Conjunctivae are normal. No scleral icterus.  Neck: Neck supple. No thyromegaly present.  Cardiovascular: Normal rate, regular rhythm, normal heart sounds  and intact distal pulses.   Pulmonary/Chest: Effort normal and breath sounds normal. No respiratory distress.  Abdominal: Soft.  Lymphadenopathy:    She has no cervical adenopathy.  Neurological: She is alert and oriented to person, place, and time. She exhibits normal muscle tone. Coordination normal.    Skin: Skin is warm.  Psychiatric: She has a normal mood and affect. Her behavior is normal. Judgment and thought content normal.         Assessment & Plan:     1. Generalized edema Stable. Continue HCTZ as below. - hydrochlorothiazide (HYDRODIURIL) 25 MG tablet; Take 1 tablet (25 mg total) by mouth every morning.  Dispense: 30 tablet; Refill: 12  2. Essential hypertension Worsening. Continue healthy lifestyle. Add Losartan as below. Pt hesitant to start Amlodipine due to possible swelling side effect. FU 1-2 months. May need to increase Losartan at that time. - losartan (COZAAR) 50 MG tablet; Take 1 tablet (50 mg total) by mouth daily.  Dispense: 30 tablet; Refill: 3  3. Gastroesophageal reflux disease, esophagitis presence not specified Stable. Continue current medications as below. - pantoprazole (PROTONIX) 40 MG tablet; Take 1 tablet (40 mg total) by mouth 2 (two) times daily.  Dispense: 60 tablet; Refill: 12 - ranitidine (ZANTAC) 300 MG tablet; Take 1 tablet (300 mg total) by mouth daily.  Dispense: 30 tablet; Refill: 12    4. History of renal cell carcinoma Patient seen and examined by Miguel Aschoff, MD, and note scribed by Renaldo Fiddler, CMA.   Richard Cranford Mon, MD  [facility]

## 2016-05-07 ENCOUNTER — Other Ambulatory Visit: Payer: Self-pay | Admitting: Family Medicine

## 2016-05-14 DIAGNOSIS — H5213 Myopia, bilateral: Secondary | ICD-10-CM | POA: Diagnosis not present

## 2016-05-21 ENCOUNTER — Ambulatory Visit (INDEPENDENT_AMBULATORY_CARE_PROVIDER_SITE_OTHER): Payer: BLUE CROSS/BLUE SHIELD | Admitting: Family Medicine

## 2016-05-21 DIAGNOSIS — I10 Essential (primary) hypertension: Secondary | ICD-10-CM

## 2016-05-21 MED ORDER — LOSARTAN POTASSIUM 100 MG PO TABS: 100.0000 mg | ORAL_TABLET | Freq: Every day | ORAL | 12 refills | Status: DC

## 2016-05-21 NOTE — Progress Notes (Signed)
Jaime Ramirez  MRN: [id number] DOB: August 10, 1951  Subjective:  HPI   The patient is a 65 year old female who presents for follow up of her hypertension.  She was last seen on 04/01/16.  Her blood pressure at that time was 172/90.  She was started on Losartan 50 mg daily.  She states she has been having the nurse at work check her blood pressure and the readings have had a wide range.  Systolic has been from [id number] and the diastolic has ranged from 0000000.  She states she has been compliant and reports no side effects with the medication.  Patient does not wish to have a flu shot today.  Patient Active Problem List   Diagnosis Date Noted  . Edema 04/01/2016  . Special screening for malignant neoplasms, colon   . Ulceration of intestine   . 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 Acid reflux 12/26/2015  . BP (high blood pressure) 12/26/2015  . Cannot sleep 12/26/2015  . Hematoma 12/26/2015  . Disorder of kidney and ureter 12/26/2015  . Adiposity 12/26/2015  . Adenocarcinoma, renal cell ([place]) 12/26/2015  . Atheroma, skin 12/26/2015    Past Medical History:  Diagnosis Date  . Arthritis   . Cancer ([place])    kidney  . GERD (gastroesophageal reflux disease)     Social History   Social History  . Marital status: Divorced    Spouse name: N/A  . Number of children: N/A  . Years of education: N/A   Occupational History  . Not on file.   Social History Main Topics  . Smoking status: Never Smoker  . Smokeless tobacco: Never Used  . Alcohol use 0.0 oz/week     Comment: 2 per week  . Drug use: No  . Sexual activity: Not on file   Other Topics Concern  . Not on file   Social History Narrative  . No narrative on file    Outpatient Encounter Prescriptions as of 05/21/2016  Medication Sig Note  . aspirin 81 MG tablet Take by mouth. 12/26/2015: Received from: [facility]  . hydrochlorothiazide (HYDRODIURIL) 25 MG tablet Take 1 tablet (25 mg total) by mouth  every morning.   Marland Kitchen losartan (COZAAR) 50 MG tablet Take 1 tablet (50 mg total) by mouth daily.   . naproxen sodium (ANAPROX) 220 MG tablet Take 440 mg by mouth daily.   . pantoprazole (PROTONIX) 40 MG tablet Take 1 tablet (40 mg total) by mouth 2 (two) times daily.   . ranitidine (ZANTAC) 300 MG tablet Take 1 tablet (300 mg total) by mouth daily.   . [DISCONTINUED] pantoprazole (PROTONIX) 40 MG tablet TAKE 1 TABLET BY MOUTH TWICE DAILY    No facility-administered encounter medications on file as of 05/21/2016.     Allergies  Allergen Reactions  . Codeine   . Hydromorphone Hives    Review of Systems  Constitutional: Negative for fever and malaise/fatigue.  Respiratory: Positive for cough (dry cough). Negative for shortness of breath and wheezing.   Cardiovascular: Positive for leg swelling (Left foot and ankle from injury). Negative for chest pain, palpitations, orthopnea, claudication and PND.  Gastrointestinal: Negative.   Neurological: Negative for dizziness, weakness and headaches.  Psychiatric/Behavioral: Negative.    Objective:  BP (!) 168/80   Pulse 76   Temp 98 F (36.7 C) (Oral)   Resp 16   Wt 230 lb (104.3 kg)   BMI 33.00 kg/m   Physical Exam  Constitutional: She is well-developed,  well-nourished, and in no distress.  HENT:  Head: Normocephalic and atraumatic.  Eyes: Conjunctivae are normal. No scleral icterus.  Neck: No thyromegaly present.  Cardiovascular: Normal rate, regular rhythm and normal heart sounds.   Pulmonary/Chest: Effort normal and breath sounds normal.  Abdominal: Soft.  Lymphadenopathy:    She has no cervical adenopathy.  Skin: Skin is warm and dry.  Psychiatric: Mood, memory, affect and judgment normal.    Assessment and Plan :   1. Essential hypertension   - losartan (COZAAR) 100 MG tablet; Take 1 tablet (100 mg total) by mouth daily.  Dispense: 30 tablet; Refill: 12 2. Renal cell carcinoma This is remote. We'll check with patient the  exact year this was removed. She is status post nephrectomy for this I have done the exam and reviewed the chart and it is accurate to the best of my knowledge. Miguel Aschoff M.D. [facility]

## 2016-07-21 ENCOUNTER — Ambulatory Visit: Payer: BLUE CROSS/BLUE SHIELD | Admitting: Family Medicine

## 2016-08-26 ENCOUNTER — Telehealth: Payer: Self-pay | Admitting: Family Medicine

## 2016-08-26 DIAGNOSIS — Z1231 Encounter for screening mammogram for malignant neoplasm of breast: Secondary | ICD-10-CM

## 2016-08-26 NOTE — Telephone Encounter (Signed)
Pt is requesting an order sent to [facility] so she can have her yearly mammogram.  CB#[phone]/MW

## 2016-08-26 NOTE — Telephone Encounter (Addendum)
Diagnostic screening mammogram added. Patient advised.

## 2016-08-26 NOTE — Telephone Encounter (Signed)
ok 

## 2016-08-26 NOTE — Telephone Encounter (Signed)
Pt called saying [facility] said Dr Rosanna Randy needed to order a diagnostic order to them so she the pt. Can get her mammogram.  Pt's call back is [phone]  [facility]

## 2016-08-26 NOTE — Telephone Encounter (Signed)
Mammogram ordered. Patient advised that she can call [facility] to schedule a appointment.

## 2016-08-28 ENCOUNTER — Other Ambulatory Visit: Payer: Self-pay

## 2016-08-28 DIAGNOSIS — R921 Mammographic calcification found on diagnostic imaging of breast: Secondary | ICD-10-CM

## 2016-08-28 DIAGNOSIS — Z1239 Encounter for other screening for malignant neoplasm of breast: Secondary | ICD-10-CM

## 2016-08-28 NOTE — Progress Notes (Signed)
Pt called stating that the mammogram ordered in EMR was wrong per [facility], and we should call [facility] so we can order the correct mammo. Spoke with representative at [facility], who advised me to order a bilateral diagnostic mammogram for right breast calcifications FU, as well as yearly screening mammogram. Renaldo Fiddler, CMA

## 2016-09-03 ENCOUNTER — Other Ambulatory Visit: Payer: Self-pay | Admitting: Family Medicine

## 2016-09-03 DIAGNOSIS — R928 Other abnormal and inconclusive findings on diagnostic imaging of breast: Secondary | ICD-10-CM

## 2016-09-17 ENCOUNTER — Ambulatory Visit
Admission: RE | Admit: 2016-09-17 | Discharge: 2016-09-17 | Disposition: A | Discharge status: Home / Self Care | Payer: BLUE CROSS/BLUE SHIELD | Source: Ambulatory Visit | Attending: Family Medicine | Admitting: Family Medicine

## 2016-09-17 ENCOUNTER — Other Ambulatory Visit: Payer: Self-pay | Admitting: Family Medicine

## 2016-09-17 DIAGNOSIS — R921 Mammographic calcification found on diagnostic imaging of breast: Secondary | ICD-10-CM | POA: Diagnosis not present

## 2016-09-17 DIAGNOSIS — Z1231 Encounter for screening mammogram for malignant neoplasm of breast: Secondary | ICD-10-CM | POA: Diagnosis not present

## 2016-09-17 DIAGNOSIS — R928 Other abnormal and inconclusive findings on diagnostic imaging of breast: Secondary | ICD-10-CM

## 2016-09-17 DIAGNOSIS — Z1239 Encounter for other screening for malignant neoplasm of breast: Secondary | ICD-10-CM

## 2016-12-30 DIAGNOSIS — M19072 Primary osteoarthritis, left ankle and foot: Secondary | ICD-10-CM | POA: Diagnosis not present

## 2017-01-05 ENCOUNTER — Ambulatory Visit (INDEPENDENT_AMBULATORY_CARE_PROVIDER_SITE_OTHER): Payer: BLUE CROSS/BLUE SHIELD | Admitting: Family Medicine

## 2017-01-05 ENCOUNTER — Encounter: Payer: Self-pay | Admitting: Family Medicine

## 2017-01-05 VITALS — BP 128/82 | HR 62 | Temp 98.0°F | Resp 16 | Ht 70.0 in | Wt 233.0 lb

## 2017-01-05 DIAGNOSIS — I10 Essential (primary) hypertension: Secondary | ICD-10-CM

## 2017-01-05 DIAGNOSIS — K219 Gastro-esophageal reflux disease without esophagitis: Secondary | ICD-10-CM

## 2017-01-05 DIAGNOSIS — Z0000 Encounter for general adult medical examination without abnormal findings: Secondary | ICD-10-CM

## 2017-01-05 DIAGNOSIS — Z23 Encounter for immunization: Secondary | ICD-10-CM | POA: Diagnosis not present

## 2017-01-05 NOTE — Progress Notes (Signed)
Patient: Jaime Ramirez, Female    DOB: June 15, 1951, 66 y.o.   MRN: [id number] Visit Date: 01/05/2017  Today's Provider: [name], MD   Chief Complaint  Patient presents with  . Annual Exam   Subjective:    Annual physical exam CAMREE Jaime Ramirez is a 66 y.o. female who presents today for health maintenance and complete physical. She feels well. She reports exercising occasionally. She reports she is sleeping well.  Colonoscopy- 02/21/2016. Internal hemorrhoids.  Mammogram- 09/17/2016. Normal.     Review of Systems  Constitutional: Negative.   HENT: Negative.   Eyes: Negative.   Respiratory: Negative.   Cardiovascular: Negative.   Gastrointestinal: Negative.   Endocrine: Negative.   Genitourinary: Negative.   Musculoskeletal: Negative.   Skin: Negative.   Allergic/Immunologic: Negative.   Neurological: Negative.   Hematological: Negative.   Psychiatric/Behavioral: Negative.     Social History      She  reports that she has never smoked. She has never used smokeless tobacco. She reports that she drinks alcohol. She reports that she does not use drugs.       Social History   Social History  . Marital status: Divorced    Spouse name: N/A  . Number of children: N/A  . Years of education: N/A   Social History Main Topics  . Smoking status: Never Smoker  . Smokeless tobacco: Never Used  . Alcohol use 0.0 oz/week     Comment: 2 per week  . Drug use: No  . Sexual activity: Not on file   Other Topics Concern  . Not on file   Social History Narrative  . No narrative on file    Past Medical History:  Diagnosis Date  . Arthritis   . Cancer ([place])    kidney  . GERD (gastroesophageal reflux disease)      Patient Active Problem List   Diagnosis Date Noted  . Edema 04/01/2016  . Special screening for malignant neoplasms, colon   . Ulceration of intestine   . 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 Acid reflux 12/26/2015  . BP (high blood  pressure) 12/26/2015  . Cannot sleep 12/26/2015  . Hematoma 12/26/2015  . Disorder of kidney and ureter 12/26/2015  . Adiposity 12/26/2015  . Adenocarcinoma, renal cell ([place]) 12/26/2015  . Atheroma, skin 12/26/2015    Past Surgical History:  Procedure Laterality Date  . ANKLE SURGERY    . APPENDECTOMY    . COLONOSCOPY WITH PROPOFOL N/A 02/21/2016   Procedure: COLONOSCOPY WITH PROPOFOL;  Surgeon: Lucilla Lame, MD;  Location: [facility];  Service: Endoscopy;  Laterality: N/A;  . HERNIA REPAIR    . KIDNEY SURGERY Left    kidney removal  . TONSILLECTOMY AND ADENOIDECTOMY    . TOTAL KNEE ARTHROPLASTY Bilateral   . TUBAL LIGATION      Family History        Family Status  Relation Status  . Mother Deceased  . Father Deceased  . Sister Alive  . Brother Alive  . Daughter Alive  . Mat Aunt (Not Specified)  . [name] (Not Specified)  . MGM (Not Specified)  . MGF (Not Specified)        Her family history includes Atrial fibrillation in her mother; Brain cancer in her paternal aunt; Breast cancer in her maternal aunt and paternal aunt; Cancer in her maternal grandfather; Heart disease in her mother and sister; Hypertension in her father; Kidney disease in her  father; Lupus in her sister; Stroke in her maternal grandmother and sister.     Allergies  Allergen Reactions  . Codeine   . Hydromorphone Hives     Current Outpatient Prescriptions:  .  aspirin 81 MG tablet, Take by mouth., Disp: , Rfl:  .  hydrochlorothiazide (HYDRODIURIL) 25 MG tablet, Take 1 tablet (25 mg total) by mouth every morning., Disp: 30 tablet, Rfl: 12 .  losartan (COZAAR) 100 MG tablet, Take 1 tablet (100 mg total) by mouth daily., Disp: 30 tablet, Rfl: 12 .  naproxen sodium (ANAPROX) 220 MG tablet, Take 440 mg by mouth daily., Disp: , Rfl:  .  pantoprazole (PROTONIX) 40 MG tablet, Take 1 tablet (40 mg total) by mouth 2 (two) times daily., Disp: 60 tablet, Rfl: 12 .  ranitidine (ZANTAC) 300 MG tablet,  Take 1 tablet (300 mg total) by mouth daily., Disp: 30 tablet, Rfl: 12   Patient Care Team: Jerrol Banana., MD as PCP - General (Family Medicine)      Objective:   Vitals: BP 128/82 (BP Location: Left Arm, Patient Position: Sitting, Cuff Size: Normal)   Pulse 62   Temp 98 F (36.7 C)   Resp 16   Ht 5\' 10"  (1.778 m)   Wt 233 lb (105.7 kg)   SpO2 98%   BMI 33.43 kg/m    Vitals:   01/05/17 0905  BP: 128/82  Pulse: 62  Resp: 16  Temp: 98 F (36.7 C)  SpO2: 98%  Weight: 233 lb (105.7 kg)  Height: 5\' 10"  (1.778 m)     Physical Exam  Constitutional: She is oriented to person, place, and time. She appears well-developed and well-nourished.  HENT:  Head: Normocephalic and atraumatic.  Right Ear: External ear normal.  Left Ear: External ear normal.  Nose: Nose normal.  Eyes: Conjunctivae are normal.  Neck: Neck supple. No thyromegaly present.  Cardiovascular: Normal rate, regular rhythm and normal heart sounds.   Pulmonary/Chest: Effort normal and breath sounds normal.  Abdominal: Soft.  Neurological: She is alert and oriented to person, place, and time.  Skin: Skin is warm and dry.  Psychiatric: She has a normal mood and affect. Her behavior is normal. Judgment and thought content normal.     Depression Screen PHQ 2/9 Scores 12/31/2015  PHQ - 2 Score 0    Audit-C Alcohol Use Screening  Question Answer Points  How often do you have alcoholic drink? 1 times monthly 0  On days you do drink alcohol, how many drinks do you typically consume? 1 0  How oftey will you drink 6 or more in a total? never 0  Total Score:  0   A score of 3 or more in women, and 4 or more in men indicates increased risk for alcohol abuse, EXCEPT if all of the points are from question 1.  Functional Status Survey: Is the patient deaf or have difficulty hearing?: No Does the patient have difficulty seeing, even when wearing glasses/contacts?: Yes Does the patient have difficulty  concentrating, remembering, or making decisions?: No Does the patient have difficulty walking or climbing stairs?: No Does the patient have difficulty dressing or bathing?: No Does the patient have difficulty doing errands alone such as visiting a doctor's office or shopping?: No    Assessment & Plan:     Routine Health Maintenance and Physical Exam  Exercise Activities and Dietary recommendations Goals    None      Immunization History  Administered Date(s) Administered  .  Pneumococcal Conjugate-13 12/31/2015  . Tdap 07/18/2012, 03/20/2014  . Zoster 11/28/2014    Health Maintenance  Topic Date Due  . Hepatitis C Screening  10/16/1950  . DEXA SCAN  12/19/2015  . PNA vac Low Risk Adult (2 of 2 - PPSV23) 12/30/2016  . INFLUENZA VACCINE  03/24/2017  . MAMMOGRAM  09/17/2018  . TETANUS/TDAP  03/20/2024  . COLONOSCOPY  02/20/2026     Discussed health benefits of physical activity, and encouraged her to engage in regular exercise appropriate for her age and condition.    1. Annual physical exam  2. Essential hypertension Stable. F/U pending labs - CBC with Differential/Platelet - Comprehensive metabolic panel - Lipid panel - TSH  3. Gastroesophageal reflux disease without esophagitis Stable.  - TSH  4. Need for pneumococcal vaccine - Pneumococcal polysaccharide vaccine 23-valent greater than or equal to 2yo subcutaneous/IM    [name], MD  [facility]

## 2017-01-06 LAB — COMPREHENSIVE METABOLIC PANEL
A/G RATIO: 1.6 (ref 1.2–2.2)
ALBUMIN: 4.3 g/dL (ref 3.6–4.8)
ALK PHOS: 86 IU/L (ref 39–117)
ALT: 11 IU/L (ref 0–32)
AST: 11 IU/L (ref 0–40)
BILIRUBIN TOTAL: 0.4 mg/dL (ref 0.0–1.2)
BUN / CREAT RATIO: 18 (ref 12–28)
BUN: 20 mg/dL (ref 8–27)
CHLORIDE: 101 mmol/L (ref 96–106)
CO2: 26 mmol/L (ref 18–29)
Calcium: 9.8 mg/dL (ref 8.7–10.3)
Creatinine, Ser: 1.09 mg/dL — ABNORMAL HIGH (ref 0.57–1.00)
GFR calc non Af Amer: 53 mL/min/{1.73_m2} — ABNORMAL LOW (ref >59)
GFR, EST AFRICAN AMERICAN: 61 mL/min/{1.73_m2} (ref >59)
GLOBULIN, TOTAL: 2.7 g/dL (ref 1.5–4.5)
GLUCOSE: 95 mg/dL (ref 65–99)
POTASSIUM: 4.5 mmol/L (ref 3.5–5.2)
SODIUM: 141 mmol/L (ref 134–144)
TOTAL PROTEIN: 7 g/dL (ref 6.0–8.5)

## 2017-01-06 LAB — TSH: TSH: 1.14 u[IU]/mL (ref 0.450–4.500)

## 2017-01-06 LAB — CBC WITH DIFFERENTIAL/PLATELET
BASOS ABS: 0 10*3/uL (ref 0.0–0.2)
Basos: 0 %
EOS (ABSOLUTE): 0.3 10*3/uL (ref 0.0–0.4)
Eos: 3 %
HEMOGLOBIN: 13.9 g/dL (ref 11.1–15.9)
Hematocrit: 41.9 % (ref 34.0–46.6)
Immature Grans (Abs): 0.1 10*3/uL (ref 0.0–0.1)
Immature Granulocytes: 1 %
LYMPHS ABS: 3.1 10*3/uL (ref 0.7–3.1)
Lymphs: 27 %
MCH: 28.8 pg (ref 26.6–33.0)
MCHC: 33.2 g/dL (ref 31.5–35.7)
MCV: 87 fL (ref 79–97)
MONOCYTES: 8 %
Monocytes Absolute: 1 10*3/uL — ABNORMAL HIGH (ref 0.1–0.9)
NEUTROS ABS: 7.1 10*3/uL — AB (ref 1.4–7.0)
Neutrophils: 61 %
Platelets: 340 10*3/uL (ref 150–379)
RBC: 4.82 x10E6/uL (ref 3.77–5.28)
RDW: 15 % (ref 12.3–15.4)
WBC: 11.7 10*3/uL — ABNORMAL HIGH (ref 3.4–10.8)

## 2017-01-06 LAB — LIPID PANEL
CHOLESTEROL TOTAL: 169 mg/dL (ref 100–199)
Chol/HDL Ratio: 2.6 ratio (ref 0.0–4.4)
HDL: 66 mg/dL (ref >39)
LDL Calculated: 82 mg/dL (ref 0–99)
Triglycerides: 103 mg/dL (ref 0–149)
VLDL Cholesterol Cal: 21 mg/dL (ref 5–40)

## 2017-01-07 ENCOUNTER — Telehealth: Payer: Self-pay

## 2017-01-07 NOTE — Telephone Encounter (Signed)
Left message advising pt; ok per DPR. Edvardo Honse Drozdowski, CMA  

## 2017-01-07 NOTE — Telephone Encounter (Signed)
-----   Message from Jerrol Banana., MD sent at 01/07/2017  2:28 PM EDT ----- Clinically stable. Repeat CBC and renal next month. As long as patient feels well we can just do labs and follow-up in 6 months clinically

## 2017-01-19 DIAGNOSIS — H2513 Age-related nuclear cataract, bilateral: Secondary | ICD-10-CM | POA: Diagnosis not present

## 2017-02-15 DIAGNOSIS — H43811 Vitreous degeneration, right eye: Secondary | ICD-10-CM | POA: Diagnosis not present

## 2017-04-02 DIAGNOSIS — H2513 Age-related nuclear cataract, bilateral: Secondary | ICD-10-CM | POA: Diagnosis not present

## 2017-04-09 DIAGNOSIS — H52223 Regular astigmatism, bilateral: Secondary | ICD-10-CM | POA: Diagnosis not present

## 2017-04-21 DIAGNOSIS — H6982 Other specified disorders of Eustachian tube, left ear: Secondary | ICD-10-CM | POA: Diagnosis not present

## 2017-04-22 DIAGNOSIS — H2512 Age-related nuclear cataract, left eye: Secondary | ICD-10-CM | POA: Diagnosis not present

## 2017-05-03 ENCOUNTER — Other Ambulatory Visit: Payer: Self-pay

## 2017-05-03 ENCOUNTER — Telehealth: Payer: Self-pay | Admitting: Family Medicine

## 2017-05-03 NOTE — Telephone Encounter (Signed)
An addendum to below, patient is checking her b/p and it has been 114/70-Joliana Claflin V Evaline Waltman, RMA

## 2017-05-03 NOTE — Telephone Encounter (Signed)
Pt is requesting a call back from [facility].  Pt is have side effects from her blood pressure medication.  CB#[phone]/MW

## 2017-05-03 NOTE — Telephone Encounter (Signed)
Patient advised, patient has appointment in [facility], [place]

## 2017-05-03 NOTE — Telephone Encounter (Signed)
lmtcb-Spenser Cong V Clella Mckeel, RMA  

## 2017-05-03 NOTE — Telephone Encounter (Signed)
Ok to sto losartan then f/u 1-2 months.

## 2017-05-03 NOTE — Telephone Encounter (Signed)
Spoke with patient. She was started on Losartan in September 2017. She also takes HCTZ and has been for years. She has not felt good in the past 2 month or more and feels like this is coming from Losartan. Patient has been having back and leg and stomach pains. Having diarrhea and headaches. Has gained 12 lbs and she has not changed her eating habits from how she always eats, she tries loosing weight. She states she did not have any problems until starting Blima Ledger, RMA

## 2017-05-04 ENCOUNTER — Other Ambulatory Visit: Payer: Self-pay | Admitting: Family Medicine

## 2017-05-04 DIAGNOSIS — R601 Generalized edema: Secondary | ICD-10-CM

## 2017-05-05 ENCOUNTER — Encounter: Payer: Self-pay | Admitting: *Deleted

## 2017-05-05 NOTE — Discharge Instructions (Signed)

## 2017-05-12 ENCOUNTER — Ambulatory Visit: Payer: BLUE CROSS/BLUE SHIELD | Admitting: Anesthesiology

## 2017-05-12 ENCOUNTER — Encounter
Admission: RE | Disposition: A | Discharge status: Home / Self Care | Payer: Self-pay | Source: Ambulatory Visit | Attending: Ophthalmology

## 2017-05-12 ENCOUNTER — Ambulatory Visit
Admission: RE | Admit: 2017-05-12 | Discharge: 2017-05-12 | Disposition: A | Discharge status: Home / Self Care | Payer: BLUE CROSS/BLUE SHIELD | Source: Ambulatory Visit | Attending: Ophthalmology | Admitting: Ophthalmology

## 2017-05-12 DIAGNOSIS — Z96659 Presence of unspecified artificial knee joint: Secondary | ICD-10-CM | POA: Insufficient documentation

## 2017-05-12 DIAGNOSIS — M1990 Unspecified osteoarthritis, unspecified site: Secondary | ICD-10-CM | POA: Insufficient documentation

## 2017-05-12 DIAGNOSIS — Z905 Acquired absence of kidney: Secondary | ICD-10-CM | POA: Insufficient documentation

## 2017-05-12 DIAGNOSIS — Z7982 Long term (current) use of aspirin: Secondary | ICD-10-CM | POA: Insufficient documentation

## 2017-05-12 DIAGNOSIS — Z79899 Other long term (current) drug therapy: Secondary | ICD-10-CM | POA: Insufficient documentation

## 2017-05-12 DIAGNOSIS — K219 Gastro-esophageal reflux disease without esophagitis: Secondary | ICD-10-CM | POA: Diagnosis not present

## 2017-05-12 DIAGNOSIS — I10 Essential (primary) hypertension: Secondary | ICD-10-CM | POA: Diagnosis not present

## 2017-05-12 DIAGNOSIS — R609 Edema, unspecified: Secondary | ICD-10-CM | POA: Diagnosis not present

## 2017-05-12 DIAGNOSIS — Z85528 Personal history of other malignant neoplasm of kidney: Secondary | ICD-10-CM | POA: Insufficient documentation

## 2017-05-12 DIAGNOSIS — H2512 Age-related nuclear cataract, left eye: Secondary | ICD-10-CM | POA: Diagnosis not present

## 2017-05-12 DIAGNOSIS — G250 Essential tremor: Secondary | ICD-10-CM | POA: Diagnosis not present

## 2017-05-12 HISTORY — PX: CATARACT EXTRACTION W/PHACO: SHX586

## 2017-05-12 HISTORY — DX: Essential tremor: G25.0

## 2017-05-12 HISTORY — DX: Dizziness and giddiness: R42

## 2017-05-12 SURGERY — PHACOEMULSIFICATION, CATARACT, WITH IOL INSERTION
Anesthesia: Monitor Anesthesia Care | Laterality: Left | Wound class: Clean

## 2017-05-12 MED ORDER — LIDOCAINE HCL (PF) 2 % IJ SOLN
INTRAOCULAR | Status: DC | PRN
  Administered 2017-05-12: 1 mL via INTRAOCULAR

## 2017-05-12 MED ORDER — NA HYALUR & NA CHOND-NA HYALUR 0.4-0.35 ML IO KIT
PACK | INTRAOCULAR | Status: DC | PRN
  Administered 2017-05-12: 1 mL via INTRAOCULAR

## 2017-05-12 MED ORDER — MOXIFLOXACIN HCL 0.5 % OP SOLN
1.0000 [drp] | OPHTHALMIC | Status: DC | PRN
  Administered 2017-05-12 (×3): 1 [drp] via OPHTHALMIC

## 2017-05-12 MED ORDER — OXYCODONE HCL 5 MG/5ML PO SOLN: 5.0000 mg | Freq: Once | ORAL | Status: DC | PRN

## 2017-05-12 MED ORDER — EPINEPHRINE PF 1 MG/ML IJ SOLN
INTRAOCULAR | Status: DC | PRN
  Administered 2017-05-12: 57 mL via OPHTHALMIC

## 2017-05-12 MED ORDER — FENTANYL CITRATE (PF) 100 MCG/2ML IJ SOLN: 25.0000 ug | INTRAMUSCULAR | Status: DC | PRN

## 2017-05-12 MED ORDER — ARMC OPHTHALMIC DILATING DROPS
1.0000 "application " | OPHTHALMIC | Status: DC | PRN
  Administered 2017-05-12 (×3): 1 via OPHTHALMIC

## 2017-05-12 MED ORDER — PROMETHAZINE HCL 25 MG/ML IJ SOLN: 6.2500 mg | INTRAMUSCULAR | Status: DC | PRN

## 2017-05-12 MED ORDER — CEFUROXIME OPHTHALMIC INJECTION 1 MG/0.1 ML
INJECTION | OPHTHALMIC | Status: DC | PRN
  Administered 2017-05-12: 0.1 mL via INTRACAMERAL

## 2017-05-12 MED ORDER — OXYCODONE HCL 5 MG PO TABS: 5.0000 mg | ORAL_TABLET | Freq: Once | ORAL | Status: DC | PRN

## 2017-05-12 MED ORDER — MEPERIDINE HCL 25 MG/ML IJ SOLN: 6.2500 mg | INTRAMUSCULAR | Status: DC | PRN

## 2017-05-12 MED ORDER — BRIMONIDINE TARTRATE-TIMOLOL 0.2-0.5 % OP SOLN
OPHTHALMIC | Status: DC | PRN
  Administered 2017-05-12: 1 [drp] via OPHTHALMIC

## 2017-05-12 MED ORDER — MIDAZOLAM HCL 2 MG/2ML IJ SOLN
INTRAMUSCULAR | Status: DC | PRN
  Administered 2017-05-12: 2 mg via INTRAVENOUS

## 2017-05-12 MED ORDER — FENTANYL CITRATE (PF) 100 MCG/2ML IJ SOLN
INTRAMUSCULAR | Status: DC | PRN
Start: 2017-05-12 — End: 2017-05-12
  Administered 2017-05-12: 50 ug via INTRAVENOUS

## 2017-05-12 MED ORDER — LACTATED RINGERS IV SOLN: 10.0000 mL/h | INTRAVENOUS | Status: DC

## 2017-05-12 SURGICAL SUPPLY — 18 items
CANNULA ANT/CHMB 27GA (MISCELLANEOUS) ×2 IMPLANT
GLOVE SURG LX 7.5 STRW (GLOVE) ×1
GLOVE SURG LX STRL 7.5 STRW (GLOVE) ×1 IMPLANT
GLOVE SURG TRIUMPH 8.0 PF LTX (GLOVE) ×2 IMPLANT
GOWN STRL REUS W/ TWL LRG LVL3 (GOWN DISPOSABLE) ×2 IMPLANT
GOWN STRL REUS W/TWL LRG LVL3 (GOWN DISPOSABLE) ×2
LENS IOL TECNIS ITEC 11.0 (Intraocular Lens) ×2 IMPLANT
MARKER SKIN DUAL TIP RULER LAB (MISCELLANEOUS) ×2 IMPLANT
NEEDLE FILTER BLUNT 18X 1/2SAF (NEEDLE) ×1
NEEDLE FILTER BLUNT 18X1 1/2 (NEEDLE) ×1 IMPLANT
PACK CATARACT BRASINGTON (MISCELLANEOUS) ×2 IMPLANT
PACK EYE AFTER SURG (MISCELLANEOUS) ×2 IMPLANT
PACK OPTHALMIC (MISCELLANEOUS) ×2 IMPLANT
SYR 3ML LL SCALE MARK (SYRINGE) ×2 IMPLANT
SYR 5ML LL (SYRINGE) ×2 IMPLANT
SYR TB 1ML LUER SLIP (SYRINGE) ×2 IMPLANT
WATER STERILE IRR 250ML POUR (IV SOLUTION) ×2 IMPLANT
WIPE NON LINTING 3.25X3.25 (MISCELLANEOUS) ×2 IMPLANT

## 2017-05-12 NOTE — Anesthesia Preprocedure Evaluation (Signed)
Anesthesia Evaluation  Patient identified by MRN, date of birth, ID band  Reviewed: Allergy & Precautions, H&P , NPO status , Patient's Chart, lab work & pertinent test results  Airway Mallampati: II  TM Distance: >3 FB Neck ROM: full    Dental no notable dental hx.    Pulmonary    Pulmonary exam normal        Cardiovascular hypertension,  Rhythm:regular Rate:Normal     Neuro/Psych    GI/Hepatic GERD  ,  Endo/Other    Renal/GU      Musculoskeletal   Abdominal   Peds  Hematology   Anesthesia Other Findings   Reproductive/Obstetrics                             Anesthesia Physical  Anesthesia Plan  ASA: II  Anesthesia Plan: MAC   Post-op Pain Management:    Induction:   PONV Risk Score and Plan:   Airway Management Planned:   Additional Equipment:   Intra-op Plan:   Post-operative Plan:   Informed Consent: I have reviewed the patients History and Physical, chart, labs and discussed the procedure including the risks, benefits and alternatives for the proposed anesthesia with the patient or authorized representative who has indicated his/her understanding and acceptance.     Plan Discussed with: CRNA  Anesthesia Plan Comments:         Anesthesia Quick Evaluation  

## 2017-05-12 NOTE — H&P (Signed)
The History and Physical notes are on paper, have been signed, and are to be scanned. The patient remains stable and unchanged from the H&P.   Previous H&P reviewed, patient examined, and there are no changes.  Tad Fancher 05/12/2017 9:30 AM

## 2017-05-12 NOTE — Anesthesia Procedure Notes (Signed)
Procedure Name: MAC Performed by: Gennaro Lizotte Pre-anesthesia Checklist: Patient identified, Emergency Drugs available, Suction available, Timeout performed and Patient being monitored Patient Re-evaluated:Patient Re-evaluated prior to inductionOxygen Delivery Method: Nasal cannula Placement Confirmation: positive ETCO2     

## 2017-05-12 NOTE — Op Note (Signed)
OPERATIVE NOTE  [name] [id number] 05/12/2017   PREOPERATIVE DIAGNOSIS:  Nuclear sclerotic cataract left eye. H25.12   POSTOPERATIVE DIAGNOSIS:    Nuclear sclerotic cataract left eye.     PROCEDURE:  Phacoemusification with posterior chamber intraocular lens placement of the left eye   LENS:   Implant Name Type Inv. Item Serial No. Manufacturer Lot No. LRB No. Used  LENS IOL DIOP 11.0 - [id number] Intraocular Lens LENS IOL DIOP 11.0 [id number] AMO   Left 1        ULTRASOUND TIME: 21  % of 1 minutes 0 seconds, CDE 12.7  SURGEON:  [name], MD   ANESTHESIA:  Topical with tetracaine drops and 2% Xylocaine jelly, augmented with 1% preservative-free intracameral lidocaine.    COMPLICATIONS:  None.   DESCRIPTION OF PROCEDURE:  The patient was identified in the holding room and transported to the operating room and placed in the supine position under the operating microscope.  The left eye was identified as the operative eye and it was prepped and draped in the usual sterile ophthalmic fashion.   A 1 millimeter clear-corneal paracentesis was made at the 1:30 position.  0.5 ml of preservative-free 1% lidocaine was injected into the anterior chamber.  The anterior chamber was filled with Viscoat viscoelastic.  A 2.4 millimeter keratome was used to make a near-clear corneal incision at the 10:30 position.  .  A curvilinear capsulorrhexis was made with a cystotome and capsulorrhexis forceps.  Balanced salt solution was used to hydrodissect and hydrodelineate the nucleus.   Phacoemulsification was then used in stop and chop fashion to remove the lens nucleus and epinucleus.  The remaining cortex was then removed using the irrigation and aspiration handpiece. Provisc was then placed into the capsular bag to distend it for lens placement.  A lens was then injected into the capsular bag.  The remaining viscoelastic was aspirated.   Wounds were hydrated with balanced salt  solution.  The anterior chamber was inflated to a physiologic pressure with balanced salt solution.  No wound leaks were noted. Cefuroxime 0.1 ml of a 10mg /ml solution was injected into the anterior chamber for a dose of 1 mg of intracameral antibiotic at the completion of the case.   Timolol and Brimonidine drops were applied to the eye.  The patient was taken to the recovery room in stable condition without complications of anesthesia or surgery.  Jaime Ramirez 05/12/2017, 10:26 AM

## 2017-05-12 NOTE — Transfer of Care (Signed)
Immediate Anesthesia Transfer of Care Note  Patient: Jaime Ramirez  Procedure(s) Performed: Procedure(s) with comments: CATARACT EXTRACTION PHACO AND INTRAOCULAR LENS PLACEMENT (IOC)LEFT (Left) - IVA TOPICAL LEFT  Patient Location: PACU  Anesthesia Type: MAC  Level of Consciousness: awake, alert  and patient cooperative  Airway and Oxygen Therapy: Patient Spontanous Breathing and Patient connected to supplemental oxygen  Post-op Assessment: Post-op Vital signs reviewed, Patient's Cardiovascular Status Stable, Respiratory Function Stable, Patent Airway and No signs of Nausea or vomiting  Post-op Vital Signs: Reviewed and stable  Complications: No apparent anesthesia complications

## 2017-05-12 NOTE — Anesthesia Postprocedure Evaluation (Signed)
Anesthesia Post Note  Patient: Jaime Ramirez  Procedure(s) Performed: Procedure(s) (LRB): CATARACT EXTRACTION PHACO AND INTRAOCULAR LENS PLACEMENT (IOC)LEFT (Left)  Patient location during evaluation: PACU Anesthesia Type: MAC Level of consciousness: awake and alert Pain management: pain level controlled Vital Signs Assessment: post-procedure vital signs reviewed and stable Respiratory status: spontaneous breathing, nonlabored ventilation, respiratory function stable and patient connected to nasal cannula oxygen Cardiovascular status: blood pressure returned to baseline and stable Postop Assessment: no apparent nausea or vomiting Anesthetic complications: no    SCOURAS, NICOLE ELAINE

## 2017-05-13 ENCOUNTER — Encounter: Payer: Self-pay | Admitting: Ophthalmology

## 2017-05-19 ENCOUNTER — Other Ambulatory Visit: Payer: Self-pay | Admitting: Family Medicine

## 2017-05-19 DIAGNOSIS — K219 Gastro-esophageal reflux disease without esophagitis: Secondary | ICD-10-CM

## 2017-06-02 ENCOUNTER — Encounter: Payer: Self-pay | Admitting: *Deleted

## 2017-06-02 DIAGNOSIS — H2511 Age-related nuclear cataract, right eye: Secondary | ICD-10-CM | POA: Diagnosis not present

## 2017-06-04 NOTE — Discharge Instructions (Signed)

## 2017-06-09 ENCOUNTER — Ambulatory Visit
Admission: RE | Admit: 2017-06-09 | Discharge: 2017-06-09 | Disposition: A | Discharge status: Home / Self Care | Payer: BLUE CROSS/BLUE SHIELD | Source: Ambulatory Visit | Attending: Ophthalmology | Admitting: Ophthalmology

## 2017-06-09 ENCOUNTER — Ambulatory Visit: Payer: BLUE CROSS/BLUE SHIELD | Admitting: Anesthesiology

## 2017-06-09 ENCOUNTER — Encounter
Admission: RE | Disposition: A | Discharge status: Home / Self Care | Payer: Self-pay | Source: Ambulatory Visit | Attending: Ophthalmology

## 2017-06-09 DIAGNOSIS — Z96659 Presence of unspecified artificial knee joint: Secondary | ICD-10-CM | POA: Insufficient documentation

## 2017-06-09 DIAGNOSIS — M1990 Unspecified osteoarthritis, unspecified site: Secondary | ICD-10-CM | POA: Insufficient documentation

## 2017-06-09 DIAGNOSIS — Z7982 Long term (current) use of aspirin: Secondary | ICD-10-CM | POA: Diagnosis not present

## 2017-06-09 DIAGNOSIS — H2511 Age-related nuclear cataract, right eye: Secondary | ICD-10-CM | POA: Diagnosis not present

## 2017-06-09 DIAGNOSIS — I10 Essential (primary) hypertension: Secondary | ICD-10-CM | POA: Diagnosis not present

## 2017-06-09 DIAGNOSIS — K219 Gastro-esophageal reflux disease without esophagitis: Secondary | ICD-10-CM | POA: Insufficient documentation

## 2017-06-09 DIAGNOSIS — Z885 Allergy status to narcotic agent status: Secondary | ICD-10-CM | POA: Diagnosis not present

## 2017-06-09 DIAGNOSIS — Z79899 Other long term (current) drug therapy: Secondary | ICD-10-CM | POA: Diagnosis not present

## 2017-06-09 DIAGNOSIS — G250 Essential tremor: Secondary | ICD-10-CM | POA: Diagnosis not present

## 2017-06-09 DIAGNOSIS — Z85528 Personal history of other malignant neoplasm of kidney: Secondary | ICD-10-CM | POA: Insufficient documentation

## 2017-06-09 HISTORY — PX: CATARACT EXTRACTION W/PHACO: SHX586

## 2017-06-09 SURGERY — PHACOEMULSIFICATION, CATARACT, WITH IOL INSERTION
Anesthesia: Monitor Anesthesia Care | Laterality: Right | Wound class: Clean

## 2017-06-09 MED ORDER — MIDAZOLAM HCL 2 MG/2ML IJ SOLN
INTRAMUSCULAR | Status: DC | PRN
  Administered 2017-06-09: 2 mg via INTRAVENOUS

## 2017-06-09 MED ORDER — PROMETHAZINE HCL 25 MG/ML IJ SOLN: 6.2500 mg | INTRAMUSCULAR | Status: DC | PRN

## 2017-06-09 MED ORDER — FENTANYL CITRATE (PF) 100 MCG/2ML IJ SOLN
INTRAMUSCULAR | Status: DC | PRN
  Administered 2017-06-09: 50 ug via INTRAVENOUS

## 2017-06-09 MED ORDER — NA HYALUR & NA CHOND-NA HYALUR 0.4-0.35 ML IO KIT
PACK | INTRAOCULAR | Status: DC | PRN
  Administered 2017-06-09: 1 mL via INTRAOCULAR

## 2017-06-09 MED ORDER — CEFUROXIME OPHTHALMIC INJECTION 1 MG/0.1 ML
INJECTION | OPHTHALMIC | Status: DC | PRN
  Administered 2017-06-09: 1 mg via INTRACAMERAL

## 2017-06-09 MED ORDER — OXYCODONE HCL 5 MG PO TABS: 5.0000 mg | ORAL_TABLET | Freq: Once | ORAL | Status: DC | PRN

## 2017-06-09 MED ORDER — OXYCODONE HCL 5 MG/5ML PO SOLN: 5.0000 mg | Freq: Once | ORAL | Status: DC | PRN

## 2017-06-09 MED ORDER — LIDOCAINE HCL (PF) 2 % IJ SOLN
INTRAOCULAR | Status: DC | PRN
  Administered 2017-06-09: 1 mL

## 2017-06-09 MED ORDER — MEPERIDINE HCL 25 MG/ML IJ SOLN: 6.2500 mg | INTRAMUSCULAR | Status: DC | PRN

## 2017-06-09 MED ORDER — FENTANYL CITRATE (PF) 100 MCG/2ML IJ SOLN: 25.0000 ug | INTRAMUSCULAR | Status: DC | PRN

## 2017-06-09 MED ORDER — ARMC OPHTHALMIC DILATING DROPS
1.0000 "application " | OPHTHALMIC | Status: DC | PRN
  Administered 2017-06-09 (×3): 1 via OPHTHALMIC

## 2017-06-09 MED ORDER — LACTATED RINGERS IV SOLN: 10.0000 mL/h | INTRAVENOUS | Status: DC

## 2017-06-09 MED ORDER — MOXIFLOXACIN HCL 0.5 % OP SOLN
1.0000 [drp] | OPHTHALMIC | Status: DC | PRN
  Administered 2017-06-09 (×3): 1 [drp] via OPHTHALMIC

## 2017-06-09 MED ORDER — BRIMONIDINE TARTRATE-TIMOLOL 0.2-0.5 % OP SOLN
OPHTHALMIC | Status: DC | PRN
  Administered 2017-06-09: 1 [drp] via OPHTHALMIC

## 2017-06-09 SURGICAL SUPPLY — 25 items
CANNULA ANT/CHMB 27GA (MISCELLANEOUS) ×2 IMPLANT
CARTRIDGE ABBOTT (MISCELLANEOUS) IMPLANT
GLOVE SURG LX 7.5 STRW (GLOVE) ×1
GLOVE SURG LX STRL 7.5 STRW (GLOVE) ×1 IMPLANT
GLOVE SURG TRIUMPH 8.0 PF LTX (GLOVE) ×2 IMPLANT
GOWN STRL REUS W/ TWL LRG LVL3 (GOWN DISPOSABLE) ×2 IMPLANT
GOWN STRL REUS W/TWL LRG LVL3 (GOWN DISPOSABLE) ×2
LENS IOL TECNIS ITEC 10.0 (Intraocular Lens) ×2 IMPLANT
MARKER SKIN DUAL TIP RULER LAB (MISCELLANEOUS) ×2 IMPLANT
NDL RETROBULBAR .5 NSTRL (NEEDLE) IMPLANT
NEEDLE FILTER BLUNT 18X 1/2SAF (NEEDLE) ×1
NEEDLE FILTER BLUNT 18X1 1/2 (NEEDLE) ×1 IMPLANT
PACK CATARACT BRASINGTON (MISCELLANEOUS) ×2 IMPLANT
PACK EYE AFTER SURG (MISCELLANEOUS) ×2 IMPLANT
PACK OPTHALMIC (MISCELLANEOUS) ×2 IMPLANT
RING MALYGIN 7.0 (MISCELLANEOUS) IMPLANT
SUT ETHILON 10-0 CS-B-6CS-B-6 (SUTURE)
SUT VICRYL  9 0 (SUTURE)
SUT VICRYL 9 0 (SUTURE) IMPLANT
SUTURE EHLN 10-0 CS-B-6CS-B-6 (SUTURE) IMPLANT
SYR 3ML LL SCALE MARK (SYRINGE) ×2 IMPLANT
SYR 5ML LL (SYRINGE) ×2 IMPLANT
SYR TB 1ML LUER SLIP (SYRINGE) ×2 IMPLANT
WATER STERILE IRR 250ML POUR (IV SOLUTION) ×2 IMPLANT
WIPE NON LINTING 3.25X3.25 (MISCELLANEOUS) ×2 IMPLANT

## 2017-06-09 NOTE — Anesthesia Preprocedure Evaluation (Signed)
Anesthesia Evaluation  Patient identified by MRN, date of birth, ID band  Reviewed: Allergy & Precautions, H&P , NPO status , Patient's Chart, lab work & pertinent test results  Airway Mallampati: II  TM Distance: >3 FB Neck ROM: full    Dental no notable dental hx.    Pulmonary    Pulmonary exam normal        Cardiovascular hypertension,  Rhythm:regular Rate:Normal     Neuro/Psych    GI/Hepatic GERD  ,  Endo/Other    Renal/GU      Musculoskeletal   Abdominal   Peds  Hematology   Anesthesia Other Findings   Reproductive/Obstetrics                             Anesthesia Physical  Anesthesia Plan  ASA: II  Anesthesia Plan: MAC   Post-op Pain Management:    Induction:   PONV Risk Score and Plan:   Airway Management Planned:   Additional Equipment:   Intra-op Plan:   Post-operative Plan:   Informed Consent: I have reviewed the patients History and Physical, chart, labs and discussed the procedure including the risks, benefits and alternatives for the proposed anesthesia with the patient or authorized representative who has indicated his/her understanding and acceptance.     Plan Discussed with: CRNA  Anesthesia Plan Comments:         Anesthesia Quick Evaluation

## 2017-06-09 NOTE — Transfer of Care (Signed)
Immediate Anesthesia Transfer of Care Note  Patient: Jaime Ramirez  Procedure(s) Performed: CATARACT EXTRACTION PHACO AND INTRAOCULAR LENS PLACEMENT (IOC)  RIGHT (Right )  Patient Location: PACU  Anesthesia Type: MAC  Level of Consciousness: awake, alert  and patient cooperative  Airway and Oxygen Therapy: Patient Spontanous Breathing and Patient connected to supplemental oxygen  Post-op Assessment: Post-op Vital signs reviewed, Patient's Cardiovascular Status Stable, Respiratory Function Stable, Patent Airway and No signs of Nausea or vomiting  Post-op Vital Signs: Reviewed and stable  Complications: No apparent anesthesia complications

## 2017-06-09 NOTE — Op Note (Signed)
LOCATION:  [facility]   PREOPERATIVE DIAGNOSIS:    Nuclear sclerotic cataract right eye. H25.11   POSTOPERATIVE DIAGNOSIS:  Nuclear sclerotic cataract right eye.     PROCEDURE:  Phacoemusification with posterior chamber intraocular lens placement of the right eye   LENS:   Implant Name Type Inv. Item Serial No. Manufacturer Lot No. LRB No. Used  LENS IOL DIOP 10.0 - [id number] Intraocular Lens LENS IOL DIOP 10.0 [id number]  Right 1  LENS IOL DIOP 10.0 - [id number] Intraocular Lens LENS IOL DIOP 10.0 [id number] AMO   Right 1     1st IOL not placed.  Stuck in injector.  2nd iol placed  ULTRASOUND TIME: 16 % of 1 minutes, 3 seconds.  CDE 10.0   SURGEON:  [name], MD   ANESTHESIA:  Topical with tetracaine drops and 2% Xylocaine jelly, augmented with 1% preservative-free intracameral lidocaine.    COMPLICATIONS:  None.   DESCRIPTION OF PROCEDURE:  The patient was identified in the holding room and transported to the operating room and placed in the supine position under the operating microscope.  The right eye was identified as the operative eye and it was prepped and draped in the usual sterile ophthalmic fashion.   A 1 millimeter clear-corneal paracentesis was made at the 12:00 position.  0.5 ml of preservative-free 1% lidocaine was injected into the anterior chamber. The anterior chamber was filled with Viscoat viscoelastic.  A 2.4 millimeter keratome was used to make a near-clear corneal incision at the 9:00 position.  A curvilinear capsulorrhexis was made with a cystotome and capsulorrhexis forceps.  Balanced salt solution was used to hydrodissect and hydrodelineate the nucleus.   Phacoemulsification was then used in stop and chop fashion to remove the lens nucleus and epinucleus.  The remaining cortex was then removed using the irrigation and aspiration handpiece. Provisc was then placed into the capsular bag to distend it for lens placement.  A lens was  then injected into the capsular bag.  The remaining viscoelastic was aspirated.   Wounds were hydrated with balanced salt solution.  The anterior chamber was inflated to a physiologic pressure with balanced salt solution.  No wound leaks were noted. Cefuroxime 0.1 ml of a 10mg /ml solution was injected into the anterior chamber for a dose of 1 mg of intracameral antibiotic at the completion of the case.   Timolol and Brimonidine drops were applied to the eye.  The patient was taken to the recovery room in stable condition without complications of anesthesia or surgery.   Jasiel Belisle 06/09/2017, 10:48 AM

## 2017-06-09 NOTE — Anesthesia Procedure Notes (Signed)
Procedure Name: MAC Performed by: Aylen Stradford Pre-anesthesia Checklist: Patient identified, Emergency Drugs available, Suction available, Timeout performed and Patient being monitored Patient Re-evaluated:Patient Re-evaluated prior to inductionOxygen Delivery Method: Nasal cannula Placement Confirmation: positive ETCO2     

## 2017-06-09 NOTE — Anesthesia Postprocedure Evaluation (Signed)
Anesthesia Post Note  Patient: Jaime Ramirez  Procedure(s) Performed: CATARACT EXTRACTION PHACO AND INTRAOCULAR LENS PLACEMENT (IOC)  RIGHT (Right )  Patient location during evaluation: PACU Anesthesia Type: MAC Level of consciousness: awake and alert Pain management: pain level controlled Vital Signs Assessment: post-procedure vital signs reviewed and stable Respiratory status: spontaneous breathing, nonlabored ventilation, respiratory function stable and patient connected to nasal cannula oxygen Cardiovascular status: blood pressure returned to baseline and stable Postop Assessment: no apparent nausea or vomiting Anesthetic complications: no    SCOURAS, NICOLE ELAINE

## 2017-06-09 NOTE — H&P (Signed)
The History and Physical notes are on paper, have been signed, and are to be scanned. The patient remains stable and unchanged from the H&P.   Previous H&P reviewed, patient examined, and there are no changes.  Jaime Ramirez 06/09/2017 9:58 AM

## 2017-06-10 ENCOUNTER — Encounter: Payer: Self-pay | Admitting: Ophthalmology

## 2017-06-16 ENCOUNTER — Ambulatory Visit (INDEPENDENT_AMBULATORY_CARE_PROVIDER_SITE_OTHER): Payer: BLUE CROSS/BLUE SHIELD | Admitting: Family Medicine

## 2017-06-16 VITALS — BP 140/90 | HR 68 | Temp 97.7°F | Resp 18 | Wt 240.0 lb

## 2017-06-16 DIAGNOSIS — I10 Essential (primary) hypertension: Secondary | ICD-10-CM

## 2017-06-16 LAB — POCT URINALYSIS DIPSTICK
Bilirubin, UA: NEGATIVE
Blood, UA: NEGATIVE
GLUCOSE UA: NEGATIVE
KETONES UA: NEGATIVE
LEUKOCYTES UA: NEGATIVE
Nitrite, UA: NEGATIVE
PROTEIN UA: NEGATIVE
Urobilinogen, UA: NEGATIVE E.U./dL — AB
pH, UA: 6 (ref 5.0–8.0)

## 2017-06-16 MED ORDER — LOSARTAN POTASSIUM 50 MG PO TABS: 50.0000 mg | ORAL_TABLET | Freq: Every day | ORAL | 3 refills | Status: DC

## 2017-06-16 NOTE — Progress Notes (Signed)
Jaime Ramirez  MRN: [id number] DOB: [birth year]  Subjective:  HPI   The patient is a 66 year old female who presents for follow up of hypertension and acid reflux.  Her last visit was on 01/05/17.    Hypertension-The patient is currently on HCTZ and her blood pressure has been stable.  She is due to have her renal and CBC checked today.   The patient called on 05/03/17 complaining of back, leg and abd pain, headaches and diarrhea.  She was advised to discontinue the Losartan and keep a check on her pressure and report them today.  Her readings have ranged 130's-140's over 80.  She still has back pain and side pain.  She describes the side pain as that similar to when you have an ovarian cyst.    Her back pain she thinks is from sitting at work.  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  Since having her surgery her back is slowly getting better.  Patient declines flu vaccine.  Her kidney doctor has told her she can not have it when because it has swine flu in it.  GERD- Patient has decreased the Pantoprazole from twice daily to once daily and this has controlled her symptoms.  Patient Active Problem List   Diagnosis Date Noted  . Edema 04/01/2016  . Special screening for malignant neoplasms, colon   . Ulceration of intestine   . 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 Acid reflux 12/26/2015  . BP (high blood pressure) 12/26/2015  . Cannot sleep 12/26/2015  . Hematoma 12/26/2015  . Disorder of kidney and ureter 12/26/2015  . Adiposity 12/26/2015  . Adenocarcinoma, renal cell ([place]) 12/26/2015  . Atheroma, skin 12/26/2015    Past Medical History:  Diagnosis Date  . Arthritis    right foot, left ankle  . Cancer ([place])    kidney  . Essential tremor   . GERD (gastroesophageal reflux disease)   . Vertigo    random episodes. none in about 1 month    Social History   Social History  . Marital  status: Divorced    Spouse name: N/A  . Number of children: N/A  . Years of education: N/A   Occupational History  . Not on file.   Social History Main Topics  . Smoking status: Never Smoker  . Smokeless tobacco: Never Used  . Alcohol use 0.0 oz/week     Comment: 2 drinks a month  . Drug use: No  . Sexual activity: Not on file   Other Topics Concern  . Not on file   Social History Narrative  . No narrative on file    Outpatient Encounter Prescriptions as of 06/16/2017  Medication Sig Note  . aspirin 81 MG tablet Take by mouth. 12/26/2015: Received from: [facility]  . hydrochlorothiazide (HYDRODIURIL) 25 MG tablet TAKE 1 TABLET(25 MG) BY MOUTH EVERY MORNING   . naproxen sodium (ANAPROX) 220 MG tablet Take 440 mg by mouth daily.   . pantoprazole (PROTONIX) 40 MG tablet Take 1 tablet (40 mg total) by mouth 2 (two) times daily.   . ranitidine (ZANTAC) 300 MG tablet TAKE 1 TABLET(300 MG) BY MOUTH DAILY    No facility-administered encounter medications on file as of 06/16/2017.     Allergies  Allergen Reactions  . Codeine Hives  . Hydromorphone Hives  . Losartan     Back, leg  and stomach pain, diarrhea, headache, felt lousy    Review of Systems  Constitutional: Negative for fever and malaise/fatigue.  HENT: Negative.   Eyes: Negative.   Respiratory: Negative for cough, shortness of breath and wheezing.   Cardiovascular: Positive for leg swelling. Negative for chest pain, palpitations, orthopnea and claudication.  Gastrointestinal: Negative.   Genitourinary: Negative for dysuria, frequency, hematuria and urgency.  Skin: Negative.   Neurological: Negative.  Negative for weakness.  Endo/Heme/Allergies: Negative.   Psychiatric/Behavioral: Negative.     Objective:  There were no vitals taken for this visit.  Physical Exam  Constitutional: She is oriented to person, place, and time and well-developed, well-nourished, and in no distress.  HENT:  Head:  Normocephalic and atraumatic.  Eyes: Conjunctivae are normal. No scleral icterus.  Neck: No thyromegaly present.  Cardiovascular: Normal rate, regular rhythm and normal heart sounds.   Pulmonary/Chest: Effort normal and breath sounds normal.  Abdominal: Soft.  Neurological: She is alert and oriented to person, place, and time. Gait normal. GCS score is 15.  Skin: Skin is warm and dry.  Psychiatric: Mood, memory, affect and judgment normal.    Assessment and Plan :  HTN Back Pain Work on Personal assistant. GERD H/o Renal Cell Carcinoma  I have done the exam and reviewed the chart and it is accurate to the best of my knowledge. Development worker, community has been used and  any errors in dictation or transcription are unintentional. Miguel Aschoff M.D. [facility]

## 2017-06-17 DIAGNOSIS — I10 Essential (primary) hypertension: Secondary | ICD-10-CM | POA: Diagnosis not present

## 2017-06-17 LAB — CBC WITH DIFFERENTIAL/PLATELET
BASOS PCT: 1 %
Basophils Absolute: 62 cells/uL (ref 0–200)
EOS ABS: 291 {cells}/uL (ref 15–500)
Eosinophils Relative: 4.7 %
HCT: 41.4 % (ref 35.0–45.0)
HEMOGLOBIN: 13.9 g/dL (ref 11.7–15.5)
LYMPHS ABS: 2071 {cells}/uL (ref 850–3900)
MCH: 28.4 pg (ref 27.0–33.0)
MCHC: 33.6 g/dL (ref 32.0–36.0)
MCV: 84.5 fL (ref 80.0–100.0)
MONOS PCT: 9.2 %
MPV: 11.1 fL (ref 7.5–12.5)
NEUTROS ABS: 3205 {cells}/uL (ref 1500–7800)
Neutrophils Relative %: 51.7 %
Platelets: 278 10*3/uL (ref 140–400)
RBC: 4.9 10*6/uL (ref 3.80–5.10)
RDW: 13 % (ref 11.0–15.0)
Total Lymphocyte: 33.4 %
WBC: 6.2 10*3/uL (ref 3.8–10.8)
WBCMIX: 570 {cells}/uL (ref 200–950)

## 2017-06-17 LAB — RENAL FUNCTION PANEL
Albumin: 4.1 g/dL (ref 3.6–5.1)
BUN: 17 mg/dL (ref 7–25)
CO2: 28 mmol/L (ref 20–32)
CREATININE: 0.99 mg/dL (ref 0.50–0.99)
Calcium: 9.4 mg/dL (ref 8.6–10.4)
Chloride: 101 mmol/L (ref 98–110)
Glucose, Bld: 91 mg/dL (ref 65–99)
POTASSIUM: 3.9 mmol/L (ref 3.5–5.3)
Phosphorus: 3.4 mg/dL (ref 2.1–4.3)
Sodium: 138 mmol/L (ref 135–146)

## 2017-06-21 ENCOUNTER — Telehealth: Payer: Self-pay

## 2017-06-21 NOTE — Telephone Encounter (Signed)
Left message advising pt.   Thanks,   -Laura  

## 2017-06-21 NOTE — Telephone Encounter (Signed)
-----   Message from Jerrol Banana., MD sent at 06/18/2017 10:30 AM EDT ----- Labs good.

## 2017-07-08 ENCOUNTER — Ambulatory Visit: Payer: BLUE CROSS/BLUE SHIELD | Admitting: Family Medicine

## 2017-08-26 ENCOUNTER — Other Ambulatory Visit: Payer: Self-pay | Admitting: Family Medicine

## 2017-08-28 ENCOUNTER — Other Ambulatory Visit: Payer: Self-pay | Admitting: Family Medicine

## 2017-09-29 ENCOUNTER — Ambulatory Visit
Admission: RE | Admit: 2017-09-29 | Discharge: 2017-09-29 | Disposition: A | Discharge status: Home / Self Care | Payer: BLUE CROSS/BLUE SHIELD | Source: Ambulatory Visit | Attending: Family Medicine | Admitting: Family Medicine

## 2017-09-29 DIAGNOSIS — Z1231 Encounter for screening mammogram for malignant neoplasm of breast: Secondary | ICD-10-CM | POA: Diagnosis not present

## 2017-12-21 DIAGNOSIS — M19072 Primary osteoarthritis, left ankle and foot: Secondary | ICD-10-CM | POA: Diagnosis not present

## 2018-01-13 ENCOUNTER — Ambulatory Visit (INDEPENDENT_AMBULATORY_CARE_PROVIDER_SITE_OTHER): Payer: BLUE CROSS/BLUE SHIELD | Admitting: Family Medicine

## 2018-01-13 ENCOUNTER — Encounter: Payer: Self-pay | Admitting: Family Medicine

## 2018-01-13 VITALS — BP 136/74 | HR 62 | Temp 98.6°F | Resp 16 | Ht 70.0 in | Wt 226.0 lb

## 2018-01-13 DIAGNOSIS — I10 Essential (primary) hypertension: Secondary | ICD-10-CM | POA: Diagnosis not present

## 2018-01-13 DIAGNOSIS — Z0000 Encounter for general adult medical examination without abnormal findings: Secondary | ICD-10-CM | POA: Diagnosis not present

## 2018-01-13 NOTE — Progress Notes (Signed)
Patient: Jaime Ramirez, Female    DOB: November 26, 1950, 67 y.o.   MRN: [id number] Visit Date: 01/13/2018  Today's Provider: [name], MD   Chief Complaint  Patient presents with  . Annual Exam   Subjective:    Annual physical exam Jaime Ramirez is a 67 y.o. female who presents today for health maintenance and complete physical. She feels well. She reports exercising daily. She reports she is sleeping well.  Colonoscopy- 02/21/2016. 1 polyp removed. Benign. Mammogram- 09/29/2017. Normal.  Immunization History  Administered Date(s) Administered  . Pneumococcal Conjugate-13 12/31/2015  . Pneumococcal Polysaccharide-23 01/05/2017  . Tdap 07/18/2012, 03/20/2014  . Zoster 11/28/2014     Patient mentions that she has been off of Losartan since October (about 62months) due to causing stomach cramps. She reports that she has monitored her BP and it is no higher than 140s/80s.   Review of Systems  Constitutional: Negative.   HENT: Negative.   Eyes: Negative.   Respiratory: Negative.   Cardiovascular: Negative.   Gastrointestinal: Negative.   Endocrine: Negative.   Genitourinary: Negative.   Musculoskeletal: Negative.   Skin: Negative.   Allergic/Immunologic: Negative.   Neurological: Negative.   Hematological: Negative.   Psychiatric/Behavioral: Negative.     Social History      She  reports that she has never smoked. She has never used smokeless tobacco. She reports that she drinks alcohol. She reports that she does not use drugs.       Social History   Socioeconomic History  . Marital status: Divorced    Spouse name: Not on file  . Number of children: Not on file  . Years of education: Not on file  . Highest education level: Not on file  Occupational History  . Not on file  Social Needs  . Financial resource strain: Not on file  . Food insecurity:    Worry: Not on file    Inability: Not on file  . Transportation needs:    Medical: Not on file   Non-medical: Not on file  Tobacco Use  . Smoking status: Never Smoker  . Smokeless tobacco: Never Used  Substance and Sexual Activity  . Alcohol use: Yes    Alcohol/week: 0.0 oz    Comment: 2 drinks a month  . Drug use: No  . Sexual activity: Not on file  Lifestyle  . Physical activity:    Days per week: Not on file    Minutes per session: Not on file  . Stress: Not on file  Relationships  . Social connections:    Talks on phone: Not on file    Gets together: Not on file    Attends religious service: Not on file    Active member of club or organization: Not on file    Attends meetings of clubs or organizations: Not on file    Relationship status: Not on file  Other Topics Concern  . Not on file  Social History Narrative  . Not on file    Past Medical History:  Diagnosis Date  . Arthritis    right foot, left ankle  . Cancer ([place])    kidney  . Essential tremor   . GERD (gastroesophageal reflux disease)   . Vertigo    random episodes. none in about 1 month     Patient Active Problem List   Diagnosis Date Noted  . Edema 04/01/2016  . Special screening for malignant neoplasms, colon   .  Ulceration of intestine   . 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 Acid reflux 12/26/2015  . BP (high blood pressure) 12/26/2015  . Cannot sleep 12/26/2015  . Hematoma 12/26/2015  . Disorder of kidney and ureter 12/26/2015  . Adiposity 12/26/2015  . Adenocarcinoma, renal cell ([place]) 12/26/2015  . Atheroma, skin 12/26/2015    Past Surgical History:  Procedure Laterality Date  . ANKLE SURGERY    . APPENDECTOMY    . BREAST BIOPSY Right 06/04/2015   demonstrating fibroadenomatous change with stromal hyalinization and calcifications  . CATARACT EXTRACTION W/PHACO Left 05/12/2017   Procedure: CATARACT EXTRACTION PHACO AND INTRAOCULAR LENS PLACEMENT (IOC)LEFT;  Surgeon: Leandrew Koyanagi, MD;  Location: [facility];  Service: Ophthalmology;  Laterality: Left;  IVA  TOPICAL LEFT  . CATARACT EXTRACTION W/PHACO Right 06/09/2017   Procedure: CATARACT EXTRACTION PHACO AND INTRAOCULAR LENS PLACEMENT ([place])  RIGHT;  Surgeon: Leandrew Koyanagi, MD;  Location: [facility];  Service: Ophthalmology;  Laterality: Right;  . COLONOSCOPY WITH PROPOFOL N/A 02/21/2016   Procedure: COLONOSCOPY WITH PROPOFOL;  Surgeon: Lucilla Lame, MD;  Location: [facility];  Service: Endoscopy;  Laterality: N/A;  . HERNIA REPAIR    . KIDNEY SURGERY Left    kidney removal  . TONSILLECTOMY AND ADENOIDECTOMY    . TOTAL KNEE ARTHROPLASTY Bilateral   . TUBAL LIGATION      Family History        Family Status  Relation Name Status  . Mother  Deceased  . Father  Deceased  . Sister  Alive  . Brother  Alive  . Daughter  Alive  . Mat Aunt  (Not Specified)  . [name]  (Not Specified)  . MGM  (Not Specified)  . MGF  (Not Specified)        Her family history includes Atrial fibrillation in her mother; Brain cancer in her paternal aunt; Breast cancer in her maternal aunt and paternal aunt; Cancer in her maternal grandfather; Heart disease in her mother and sister; Hypertension in her father; Kidney disease in her father; Lupus in her sister; Stroke in her maternal grandmother and sister.      Allergies  Allergen Reactions  . Codeine Hives and Nausea Only  . Hydromorphone Hives     Current Outpatient Medications:  .  aspirin 81 MG tablet, Take by mouth., Disp: , Rfl:  .  hydrochlorothiazide (HYDRODIURIL) 25 MG tablet, TAKE 1 TABLET(25 MG) BY MOUTH EVERY MORNING, Disp: 30 tablet, Rfl: 11 .  naproxen sodium (ANAPROX) 220 MG tablet, Take 440 mg by mouth daily., Disp: , Rfl:  .  pantoprazole (PROTONIX) 40 MG tablet, Take 1 tablet (40 mg total) by mouth 2 (two) times daily., Disp: 60 tablet, Rfl: 12 .  pantoprazole (PROTONIX) 40 MG tablet, TAKE 1 TABLET BY MOUTH TWICE DAILY, Disp: 60 tablet, Rfl: 12 .  pantoprazole (PROTONIX) 40 MG tablet, TAKE 1 TABLET BY MOUTH TWICE  DAILY, Disp: 60 tablet, Rfl: 5 .  ranitidine (ZANTAC) 300 MG tablet, TAKE 1 TABLET(300 MG) BY MOUTH DAILY, Disp: 30 tablet, Rfl: 12 .  losartan (COZAAR) 50 MG tablet, Take 1 tablet (50 mg total) by mouth daily. (Patient not taking: Reported on 01/13/2018), Disp: 90 tablet, Rfl: 3   Patient Care Team: Jerrol Banana., MD as PCP - General (Family Medicine)      Objective:   Vitals: BP 136/74 (BP Location: Left Arm, Patient Position: Sitting, Cuff Size: Large)   Pulse 62   Temp 98.6 F (37 C)  Resp 16   Ht 5\' 10"  (1.778 m)   Wt 226 lb (102.5 kg)   SpO2 98%   BMI 32.43 kg/m    Vitals:   01/13/18 0954  BP: 136/74  Pulse: 62  Resp: 16  Temp: 98.6 F (37 C)  SpO2: 98%  Weight: 226 lb (102.5 kg)  Height: 5\' 10"  (1.778 m)     Physical Exam  Constitutional: She is oriented to person, place, and time. She appears well-developed and well-nourished.  HENT:  Head: Normocephalic and atraumatic.  Eyes: Pupils are equal, round, and reactive to light. Conjunctivae are normal. No scleral icterus.  Neck: No thyromegaly present.  Cardiovascular: Normal rate, regular rhythm and normal heart sounds.  Pulmonary/Chest: Effort normal and breath sounds normal.  Abdominal: Soft.  Musculoskeletal: She exhibits no edema.  Neurological: She is alert and oriented to person, place, and time.  Skin: Skin is warm and dry.  Psychiatric: She has a normal mood and affect. Her behavior is normal. Judgment and thought content normal.     Depression Screen PHQ 2/9 Scores 01/13/2018 01/05/2017 12/31/2015  PHQ - 2 Score 0 0 0      Assessment & Plan:     Routine Health Maintenance and Physical Exam  Exercise Activities and Dietary recommendations Goals    None      Immunization History  Administered Date(s) Administered  . Pneumococcal Conjugate-13 12/31/2015  . Pneumococcal Polysaccharide-23 01/05/2017  . Tdap 07/18/2012, 03/20/2014  . Zoster 11/28/2014    Health Maintenance    Topic Date Due  . DEXA SCAN  12/19/2015  . INFLUENZA VACCINE  11/18/2018 (Originally 03/24/2018)  . MAMMOGRAM  09/30/2019  . TETANUS/TDAP  03/20/2024  . COLONOSCOPY  02/20/2026  . Hepatitis C Screening  Completed  . PNA vac Low Risk Adult  Completed     Discussed health benefits of physical activity, and encouraged her to engage in regular exercise appropriate for her age and condition.  GERD Protonix a day.   I have done the exam and reviewed the above chart and it is accurate to the best of my knowledge. Development worker, community has been used in this note in any air is in the dictation or transcription are unintentional.  [name], MD  [place]

## 2018-01-14 LAB — COMPREHENSIVE METABOLIC PANEL
ALK PHOS: 88 IU/L (ref 39–117)
ALT: 10 IU/L (ref 0–32)
AST: 14 IU/L (ref 0–40)
Albumin/Globulin Ratio: 1.4 (ref 1.2–2.2)
Albumin: 3.9 g/dL (ref 3.6–4.8)
BUN / CREAT RATIO: 14 (ref 12–28)
BUN: 15 mg/dL (ref 8–27)
Bilirubin Total: 0.3 mg/dL (ref 0.0–1.2)
CHLORIDE: 98 mmol/L (ref 96–106)
CO2: 26 mmol/L (ref 20–29)
Calcium: 9.5 mg/dL (ref 8.7–10.3)
Creatinine, Ser: 1.08 mg/dL — ABNORMAL HIGH (ref 0.57–1.00)
GFR calc Af Amer: 61 mL/min/{1.73_m2} (ref >59)
GFR calc non Af Amer: 53 mL/min/{1.73_m2} — ABNORMAL LOW (ref >59)
GLUCOSE: 88 mg/dL (ref 65–99)
Globulin, Total: 2.8 g/dL (ref 1.5–4.5)
Potassium: 3.3 mmol/L — ABNORMAL LOW (ref 3.5–5.2)
Sodium: 142 mmol/L (ref 134–144)
Total Protein: 6.7 g/dL (ref 6.0–8.5)

## 2018-01-14 LAB — CBC WITH DIFFERENTIAL/PLATELET
BASOS ABS: 0 10*3/uL (ref 0.0–0.2)
Basos: 1 %
EOS (ABSOLUTE): 0.2 10*3/uL (ref 0.0–0.4)
Eos: 4 %
HEMOGLOBIN: 12.1 g/dL (ref 11.1–15.9)
Hematocrit: 36.9 % (ref 34.0–46.6)
Immature Grans (Abs): 0 10*3/uL (ref 0.0–0.1)
Immature Granulocytes: 0 %
LYMPHS ABS: 1.9 10*3/uL (ref 0.7–3.1)
Lymphs: 29 %
MCH: 27.8 pg (ref 26.6–33.0)
MCHC: 32.8 g/dL (ref 31.5–35.7)
MCV: 85 fL (ref 79–97)
MONOCYTES: 6 %
Monocytes Absolute: 0.4 10*3/uL (ref 0.1–0.9)
NEUTROS ABS: 4 10*3/uL (ref 1.4–7.0)
Neutrophils: 60 %
PLATELETS: 272 10*3/uL (ref 150–450)
RBC: 4.36 x10E6/uL (ref 3.77–5.28)
RDW: 15.3 % (ref 12.3–15.4)
WBC: 6.6 10*3/uL (ref 3.4–10.8)

## 2018-01-14 LAB — LIPID PANEL
CHOLESTEROL TOTAL: 172 mg/dL (ref 100–199)
Chol/HDL Ratio: 2.6 ratio (ref 0.0–4.4)
HDL: 65 mg/dL (ref >39)
LDL Calculated: 89 mg/dL (ref 0–99)
Triglycerides: 88 mg/dL (ref 0–149)
VLDL Cholesterol Cal: 18 mg/dL (ref 5–40)

## 2018-01-14 LAB — TSH: TSH: 1.17 u[IU]/mL (ref 0.450–4.500)

## 2018-01-19 ENCOUNTER — Telehealth: Payer: Self-pay | Admitting: Emergency Medicine

## 2018-01-19 NOTE — Telephone Encounter (Signed)
Error

## 2018-01-20 ENCOUNTER — Telehealth: Payer: Self-pay | Admitting: Family Medicine

## 2018-01-20 DIAGNOSIS — F419 Anxiety disorder, unspecified: Secondary | ICD-10-CM

## 2018-01-20 NOTE — Telephone Encounter (Signed)
Please review and send to pharmacy if you approve Thanks

## 2018-01-20 NOTE — Telephone Encounter (Signed)
ok 

## 2018-01-20 NOTE — Telephone Encounter (Signed)
Patient states that she is supposed to have a dental procedure (repair bonding on front tooth) and she gets really uptight and anxious and would like to know if you can give one 2mg  ativan to get through this procedure.  She uses [facility] in [place].

## 2018-01-24 MED ORDER — LORAZEPAM 1 MG PO TABS: 1.0000 mg | ORAL_TABLET | Freq: Once | ORAL | 1 refills | Status: AC

## 2018-01-24 NOTE — Telephone Encounter (Signed)
How do you want this Rx

## 2018-05-05 ENCOUNTER — Other Ambulatory Visit: Payer: Self-pay | Admitting: Family Medicine

## 2018-05-05 DIAGNOSIS — R601 Generalized edema: Secondary | ICD-10-CM

## 2018-06-05 ENCOUNTER — Other Ambulatory Visit: Payer: Self-pay | Admitting: Family Medicine

## 2018-06-05 DIAGNOSIS — K219 Gastro-esophageal reflux disease without esophagitis: Secondary | ICD-10-CM

## 2018-08-29 ENCOUNTER — Other Ambulatory Visit: Payer: Self-pay | Admitting: Family Medicine

## 2018-08-29 DIAGNOSIS — Z1231 Encounter for screening mammogram for malignant neoplasm of breast: Secondary | ICD-10-CM

## 2018-08-30 ENCOUNTER — Ambulatory Visit: Payer: BLUE CROSS/BLUE SHIELD | Admitting: Family Medicine

## 2018-08-30 ENCOUNTER — Encounter: Payer: Self-pay | Admitting: Family Medicine

## 2018-08-30 VITALS — BP 136/72 | HR 82 | Temp 98.3°F | Resp 16 | Ht 69.0 in | Wt 231.0 lb

## 2018-08-30 DIAGNOSIS — C649 Malignant neoplasm of unspecified kidney, except renal pelvis: Secondary | ICD-10-CM | POA: Diagnosis not present

## 2018-08-30 DIAGNOSIS — K219 Gastro-esophageal reflux disease without esophagitis: Secondary | ICD-10-CM | POA: Diagnosis not present

## 2018-08-30 DIAGNOSIS — I10 Essential (primary) hypertension: Secondary | ICD-10-CM | POA: Diagnosis not present

## 2018-08-30 NOTE — Progress Notes (Signed)
Patient: Jaime Ramirez Female    DOB: 06-26-1951   68 y.o.   MRN: [id number] Visit Date: 08/30/2018  Today's Provider: [name], MD   No chief complaint on file.  Subjective:     HPI Patient comes in today c/o stomach pains. She feels that it may be due to Losartan. She reports that she has been on the medication for about 1.5-2 years. She wants to discuss possibly changing medication.   She also mentions that she has felt gassy more than usual. She is currently taking protonix daily and zantac at night.   Allergies  Allergen Reactions  . Codeine Hives and Nausea Only  . Hydromorphone Hives     Current Outpatient Medications:  .  aspirin 81 MG tablet, Take by mouth., Disp: , Rfl:  .  hydrochlorothiazide (HYDRODIURIL) 25 MG tablet, TAKE 1 TABLET(25 MG) BY MOUTH EVERY MORNING, Disp: 30 tablet, Rfl: 12 .  naproxen sodium (ANAPROX) 220 MG tablet, Take 440 mg by mouth daily., Disp: , Rfl:  .  pantoprazole (PROTONIX) 40 MG tablet, Take 1 tablet (40 mg total) by mouth 2 (two) times daily., Disp: 60 tablet, Rfl: 12 .  pantoprazole (PROTONIX) 40 MG tablet, TAKE 1 TABLET BY MOUTH TWICE DAILY, Disp: 60 tablet, Rfl: 12 .  pantoprazole (PROTONIX) 40 MG tablet, TAKE 1 TABLET BY MOUTH TWICE DAILY, Disp: 60 tablet, Rfl: 12 .  ranitidine (ZANTAC) 300 MG tablet, TAKE 1 TABLET(300 MG) BY MOUTH DAILY, Disp: 30 tablet, Rfl: 12 .  losartan (COZAAR) 50 MG tablet, Take 1 tablet (50 mg total) by mouth daily. (Patient not taking: Reported on 01/13/2018), Disp: 90 tablet, Rfl: 3  Review of Systems  Constitutional: Negative.   HENT: Negative.   Eyes: Negative.   Respiratory: Negative for shortness of breath.   Cardiovascular: Negative for chest pain, palpitations and leg swelling.  Gastrointestinal: Positive for abdominal pain and nausea. Negative for abdominal distention, anal bleeding, blood in stool, constipation, diarrhea, rectal pain and vomiting.  Endocrine: Negative.     Musculoskeletal: Negative for arthralgias and back pain.  Neurological: Negative for dizziness, light-headedness and headaches.  Psychiatric/Behavioral: Negative.     Social History   Tobacco Use  . Smoking status: Never Smoker  . Smokeless tobacco: Never Used  Substance Use Topics  . Alcohol use: Yes    Alcohol/week: 0.0 standard drinks    Comment: 2 drinks a month      Objective:   BP 136/72   Pulse 82   Temp 98.3 F (36.8 C)   Resp 16   Ht 5\' 9"  (1.753 m)   Wt 231 lb (104.8 kg)   SpO2 98%   BMI 34.11 kg/m  Vitals:   08/30/18 1330  BP: 136/72  Pulse: 82  Resp: 16  Temp: 98.3 F (36.8 C)  SpO2: 98%  Weight: 231 lb (104.8 kg)  Height: 5\' 9"  (1.753 m)     Physical Exam Constitutional:      Appearance: She is well-developed.  HENT:     Head: Normocephalic and atraumatic.     Right Ear: External ear normal.     Left Ear: External ear normal.     Nose: Nose normal.  Eyes:     Conjunctiva/sclera: Conjunctivae normal.  Neck:     Musculoskeletal: Neck supple.     Thyroid: No thyromegaly.  Cardiovascular:     Rate and Rhythm: Normal rate and regular rhythm.     Heart sounds: Normal heart  sounds.  Pulmonary:     Effort: Pulmonary effort is normal.     Breath sounds: Normal breath sounds.  Abdominal:     Palpations: Abdomen is soft.  Skin:    General: Skin is warm and dry.  Neurological:     Mental Status: She is alert and oriented to person, place, and time.  Psychiatric:        Behavior: Behavior normal.        Thought Content: Thought content normal.        Judgment: Judgment normal.         Assessment & Plan    1. Essential hypertension Losartan after lengthy discussion with patient. She Thinks the abdominal pain may be related to starting the losartan.  2. Gastroesophageal reflux disease without esophagitis Pantoprazole 40 mg twice daily.  Return to clinic 1 month.  No lower GI symptoms.  May need further work-up if not improving. 3.h/o  Renal Cell Ca   I have done the exam and reviewed the above chart and it is accurate to the best of my knowledge. Development worker, community has been used in this note in any air is in the dictation or transcription are unintentional.  [name], MD  [facility]

## 2018-08-30 NOTE — Patient Instructions (Signed)
Increase Protonix to twice daily, and continue Zantac at bedtime.  Discontinue Losartan.

## 2018-09-30 ENCOUNTER — Ambulatory Visit
Admission: RE | Admit: 2018-09-30 | Discharge: 2018-09-30 | Disposition: A | Discharge status: Home / Self Care | Payer: BLUE CROSS/BLUE SHIELD | Source: Ambulatory Visit | Attending: Family Medicine | Admitting: Family Medicine

## 2018-09-30 DIAGNOSIS — Z1231 Encounter for screening mammogram for malignant neoplasm of breast: Secondary | ICD-10-CM | POA: Insufficient documentation

## 2018-10-06 ENCOUNTER — Ambulatory Visit: Payer: Self-pay | Admitting: Family Medicine

## 2018-11-15 DIAGNOSIS — M19072 Primary osteoarthritis, left ankle and foot: Secondary | ICD-10-CM | POA: Diagnosis not present

## 2018-11-15 DIAGNOSIS — M7661 Achilles tendinitis, right leg: Secondary | ICD-10-CM | POA: Diagnosis not present

## 2019-01-18 ENCOUNTER — Encounter: Payer: Self-pay | Admitting: Family Medicine

## 2019-01-24 ENCOUNTER — Ambulatory Visit (INDEPENDENT_AMBULATORY_CARE_PROVIDER_SITE_OTHER): Payer: BC Managed Care – PPO | Admitting: Family Medicine

## 2019-01-24 ENCOUNTER — Other Ambulatory Visit: Payer: Self-pay

## 2019-01-24 ENCOUNTER — Encounter: Payer: Self-pay | Admitting: Family Medicine

## 2019-01-24 VITALS — BP 128/80 | HR 92 | Temp 98.6°F | Resp 16 | Ht 69.0 in | Wt 225.0 lb

## 2019-01-24 DIAGNOSIS — R319 Hematuria, unspecified: Secondary | ICD-10-CM

## 2019-01-24 DIAGNOSIS — Z0000 Encounter for general adult medical examination without abnormal findings: Secondary | ICD-10-CM

## 2019-01-24 DIAGNOSIS — Z1211 Encounter for screening for malignant neoplasm of colon: Secondary | ICD-10-CM | POA: Diagnosis not present

## 2019-01-24 DIAGNOSIS — N390 Urinary tract infection, site not specified: Secondary | ICD-10-CM | POA: Diagnosis not present

## 2019-01-24 DIAGNOSIS — R109 Unspecified abdominal pain: Secondary | ICD-10-CM

## 2019-01-24 LAB — POCT URINALYSIS DIPSTICK
Bilirubin, UA: NEGATIVE
Glucose, UA: NEGATIVE
Ketones, UA: NEGATIVE
Nitrite, UA: NEGATIVE
Protein, UA: POSITIVE — AB
Spec Grav, UA: 1.01 (ref 1.010–1.025)
Urobilinogen, UA: 0.2 E.U./dL
pH, UA: 7 (ref 5.0–8.0)

## 2019-01-24 MED ORDER — SULFAMETHOXAZOLE-TRIMETHOPRIM 800-160 MG PO TABS: 1.0000 | ORAL_TABLET | Freq: Two times a day (BID) | ORAL | 0 refills | Status: AC

## 2019-01-24 NOTE — Progress Notes (Signed)
Patient: Jaime Ramirez, Female    DOB: 08-Mar-1951, 68 y.o.   MRN: [id number] Visit Date: 01/24/2019  Today's Provider: [name], MD   Chief Complaint  Patient presents with   Annual Exam   Abdominal Pain   Subjective:     Annual wellness visit Jaime Ramirez is a 68 y.o. female. She feels well. She reports exercising not regularly, but she does stay active. She reports she is sleeping well.  Patient has no complaints and feels well recently.  She has not had a GYN exam in 8 years as her last gynecologist retired man.  She has no complaints with any vaginal bleeding or pelvic pain but does have some right lower quadrant pressure with tight clothing around the waist.  Mammogram- 09/30/2018. Normal. Repeat in 1 year.  Colonoscopy- 02/21/2016. Diverticulosis. Repeat in 10 years. Dr. Allen Norris. Tdap- 03/20/2014  Review of Systems  Constitutional: Negative.   HENT: Negative.   Eyes: Negative.   Respiratory: Negative.   Cardiovascular: Negative.   Gastrointestinal: Positive for abdominal pain.  Endocrine: Negative.   Genitourinary: Negative.   Musculoskeletal: Positive for arthralgias.  Allergic/Immunologic: Negative.   Neurological: Negative.   Hematological: Negative.   Psychiatric/Behavioral: Negative.     Social History   Socioeconomic History   Marital status: Divorced    Spouse name: Not on file   Number of children: Not on file   Years of education: Not on file   Highest education level: Not on file  Occupational History   Not on file  Social Needs   Financial resource strain: Not on file   Food insecurity:    Worry: Not on file    Inability: Not on file   Transportation needs:    Medical: Not on file    Non-medical: Not on file  Tobacco Use   Smoking status: Never Smoker   Smokeless tobacco: Never Used  Substance and Sexual Activity   Alcohol use: Yes    Alcohol/week: 0.0 standard drinks    Comment: 2 drinks a month   Drug use:  No   Sexual activity: Not on file  Lifestyle   Physical activity:    Days per week: Not on file    Minutes per session: Not on file   Stress: Not on file  Relationships   Social connections:    Talks on phone: Not on file    Gets together: Not on file    Attends religious service: Not on file    Active member of club or organization: Not on file    Attends meetings of clubs or organizations: Not on file    Relationship status: Not on file   Intimate partner violence:    Fear of current or ex partner: Not on file    Emotionally abused: Not on file    Physically abused: Not on file    Forced sexual activity: Not on file  Other Topics Concern   Not on file  Social History Narrative   Not on file    Past Medical History:  Diagnosis Date   Arthritis    right foot, left ankle   Cancer ([place])    kidney   Essential tremor    GERD (gastroesophageal reflux disease)    Vertigo    random episodes. 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[place]) 12/26/2015   Atheroma, skin 12/26/2015    Past Surgical History:  Procedure Laterality Date   ANKLE SURGERY     APPENDECTOMY     BREAST BIOPSY Right 06/04/2015   demonstrating fibroadenomatous change with stromal hyalinization and calcifications   CATARACT EXTRACTION W/PHACO Left 05/12/2017   Procedure: CATARACT EXTRACTION PHACO AND INTRAOCULAR LENS PLACEMENT (IOC)LEFT;  Surgeon: Leandrew Koyanagi, MD;  Location: [place];  Service: Ophthalmology;  Laterality: Left;  IVA TOPICAL LEFT   CATARACT EXTRACTION W/PHACO  Right 06/09/2017   Procedure: CATARACT EXTRACTION PHACO AND INTRAOCULAR LENS PLACEMENT ([place])  RIGHT;  Surgeon: Leandrew Koyanagi, MD;  Location: [place];  Service: Ophthalmology;  Laterality: Right;   COLONOSCOPY WITH PROPOFOL N/A 02/21/2016   Procedure: COLONOSCOPY WITH PROPOFOL;  Surgeon: Lucilla Lame, MD;  Location: [place];  Service: Endoscopy;  Laterality: N/A;   HERNIA REPAIR     KIDNEY SURGERY Left    kidney removal   TONSILLECTOMY AND ADENOIDECTOMY     TOTAL KNEE ARTHROPLASTY Bilateral    TUBAL LIGATION      Her family history includes Atrial fibrillation in her mother; Brain cancer in her paternal aunt; Breast cancer in her maternal aunt and paternal aunt; Cancer in her maternal grandfather; Heart disease in her mother and sister; Hypertension in her father; Kidney disease in her father; Lupus in her sister; Stroke in her maternal grandmother and sister.   Current Outpatient Medications:    aspirin 81 MG tablet, Take by mouth., Disp: , Rfl:    hydrochlorothiazide (HYDRODIURIL) 25 MG tablet, TAKE 1 TABLET(25 MG) BY MOUTH EVERY MORNING, Disp: 30 tablet, Rfl: 12   naproxen sodium (ANAPROX) 220 MG tablet, Take 440 mg by mouth daily., Disp: , Rfl:    pantoprazole (PROTONIX) 40 MG tablet, Take 1 tablet (40 mg total) by mouth 2 (two) times daily., Disp: 60 tablet, Rfl: 12   pantoprazole (PROTONIX) 40 MG tablet, TAKE 1 TABLET BY MOUTH TWICE DAILY, Disp: 60 tablet, Rfl: 12   losartan (COZAAR) 50 MG tablet, Take 1 tablet (50 mg total) by mouth daily. (Patient not taking: Reported on 01/13/2018), Disp: 90 tablet, Rfl: 3   pantoprazole (PROTONIX) 40 MG tablet, TAKE 1 TABLET BY MOUTH TWICE DAILY, Disp: 60 tablet, Rfl: 12   ranitidine (ZANTAC) 300 MG tablet, TAKE 1 TABLET(300 MG) BY MOUTH DAILY (Patient not taking: Reported on 01/24/2019), Disp: 30 tablet, Rfl: 12  Patient Care Team: Jerrol Banana., MD as PCP - General (Family Medicine)    Objective:     Vitals: BP 128/80 (BP Location: Right Arm, Patient Position: Sitting, Cuff Size: Large)    Pulse 92    Temp 98.6 F (37 C)    Resp 16    Ht 5\' 9"  (1.753 m)    Wt 225 lb (102.1 kg)    SpO2 97%    BMI 33.23 kg/m   Physical Exam Constitutional:      Appearance: She is well-developed.  HENT:     Head: Normocephalic.     Right Ear: Tympanic membrane, ear canal and external ear normal.     Left Ear: Tympanic membrane, ear canal and external ear normal.     Mouth/Throat:  Mouth: Mucous membranes are moist.     Pharynx: Oropharynx is clear.  Eyes:     Extraocular Movements: Extraocular movements intact.     Pupils: Pupils are equal, round, and reactive to light.  Neck:     Musculoskeletal: Normal range of motion and neck supple.  Cardiovascular:     Rate and Rhythm: Normal rate and regular rhythm.     Pulses: Normal pulses.     Heart sounds: Normal heart sounds.  Pulmonary:     Effort: Pulmonary effort is normal.     Breath sounds: Normal breath sounds.  Chest:     Breasts: Breasts are symmetrical.        Right: Normal. No swelling, bleeding, inverted nipple, mass, nipple discharge, skin change or tenderness.        Left: Normal. No swelling, bleeding, inverted nipple, mass, nipple discharge, skin change or tenderness.  Abdominal:     Tenderness: There is abdominal tenderness in the right lower quadrant.     Comments: Right sided periumbilical tenderness. --minimal.   Genitourinary:    General: Normal vulva.     Vagina: Normal. No vaginal discharge.     Cervix: Normal.     Uterus: Normal.      Adnexa: Right adnexa normal and left adnexa normal.  Skin:    General: Skin is warm and dry.  Neurological:     Mental Status: She is alert.     Activities of Daily Living In your present state of health, do you have any difficulty performing the following activities: 01/24/2019  Hearing? N  Vision? N  Difficulty concentrating or making decisions? N  Walking or climbing stairs?  N  Dressing or bathing? N  Doing errands, shopping? N  Some recent data might be hidden    Fall Risk Assessment Fall Risk  01/24/2019 01/13/2018 01/05/2017 12/31/2015  Falls in the past year? 1 No Yes Yes  Number falls in past yr: 0 - 2 or more 2 or more  Injury with Fall? 0 - No Yes  Comment - - - abrasion to her shin  Follow up Falls evaluation completed - - -     Depression Screen PHQ 2/9 Scores 01/24/2019 01/13/2018 01/05/2017 12/31/2015  PHQ - 2 Score 0 0 0 0    No flowsheet data found.    Assessment & Plan:     Annual Wellness Visit  Reviewed patient's Family Medical History Reviewed and updated list of patient's medical providers Assessment of cognitive impairment was done Assessed patient's functional ability Established a written schedule for health screening [place] Completed and Reviewed  Exercise Activities and Dietary recommendations Goals   None     Immunization History  Administered Date(s) Administered   Pneumococcal Conjugate-13 12/31/2015   Pneumococcal Polysaccharide-23 01/05/2017   Tdap 07/18/2012, 03/20/2014   Zoster 11/28/2014    Health Maintenance  Topic Date Due   DEXA SCAN  12/19/2015   INFLUENZA VACCINE  03/25/2019   MAMMOGRAM  09/30/2020   TETANUS/TDAP  03/20/2024   COLONOSCOPY  02/20/2026   Hepatitis C Screening  Completed   PNA vac Low Risk Adult  Completed     Discussed health benefits of physical activity, and encouraged her to engage in regular exercise appropriate for her age and condition.  S/p Left Nephrectomy for Renal Cell carcinoma Mild Right Lower Quandrant Discomfort This appears to be aggravated by her clothing.  No other symptoms.  At this time obtain ultrasound of the abdomen and pelvis UTI  Treat with Septra twice daily for 5 days.   Doyce Stonehouse Cranford Mon, MD  [facility]

## 2019-01-25 DIAGNOSIS — Z0000 Encounter for general adult medical examination without abnormal findings: Secondary | ICD-10-CM | POA: Diagnosis not present

## 2019-01-26 LAB — LIPID PANEL
Chol/HDL Ratio: 2.8 ratio (ref 0.0–4.4)
Cholesterol, Total: 185 mg/dL (ref 100–199)
HDL: 66 mg/dL (ref >39)
LDL Calculated: 96 mg/dL (ref 0–99)
Triglycerides: 115 mg/dL (ref 0–149)
VLDL Cholesterol Cal: 23 mg/dL (ref 5–40)

## 2019-01-26 LAB — CBC WITH DIFFERENTIAL/PLATELET
Basophils Absolute: 0.1 10*3/uL (ref 0.0–0.2)
Basos: 1 %
EOS (ABSOLUTE): 0.3 10*3/uL (ref 0.0–0.4)
Eos: 5 %
Hematocrit: 40.6 % (ref 34.0–46.6)
Hemoglobin: 13.7 g/dL (ref 11.1–15.9)
Immature Grans (Abs): 0 10*3/uL (ref 0.0–0.1)
Immature Granulocytes: 1 %
Lymphocytes Absolute: 2 10*3/uL (ref 0.7–3.1)
Lymphs: 30 %
MCH: 28.2 pg (ref 26.6–33.0)
MCHC: 33.7 g/dL (ref 31.5–35.7)
MCV: 84 fL (ref 79–97)
Monocytes Absolute: 0.6 10*3/uL (ref 0.1–0.9)
Monocytes: 8 %
Neutrophils Absolute: 3.6 10*3/uL (ref 1.4–7.0)
Neutrophils: 55 %
Platelets: 309 10*3/uL (ref 150–450)
RBC: 4.85 x10E6/uL (ref 3.77–5.28)
RDW: 13.9 % (ref 11.7–15.4)
WBC: 6.6 10*3/uL (ref 3.4–10.8)

## 2019-01-26 LAB — COMPREHENSIVE METABOLIC PANEL
ALT: 10 IU/L (ref 0–32)
AST: 21 IU/L (ref 0–40)
Albumin/Globulin Ratio: 1.4 (ref 1.2–2.2)
Albumin: 4.1 g/dL (ref 3.8–4.8)
Alkaline Phosphatase: 102 IU/L (ref 39–117)
BUN/Creatinine Ratio: 15 (ref 12–28)
BUN: 16 mg/dL (ref 8–27)
Bilirubin Total: 0.4 mg/dL (ref 0.0–1.2)
CO2: 26 mmol/L (ref 20–29)
Calcium: 10 mg/dL (ref 8.7–10.3)
Chloride: 99 mmol/L (ref 96–106)
Creatinine, Ser: 1.06 mg/dL — ABNORMAL HIGH (ref 0.57–1.00)
GFR calc Af Amer: 62 mL/min/{1.73_m2} (ref >59)
GFR calc non Af Amer: 54 mL/min/{1.73_m2} — ABNORMAL LOW (ref >59)
Globulin, Total: 2.9 g/dL (ref 1.5–4.5)
Glucose: 97 mg/dL (ref 65–99)
Potassium: 3.1 mmol/L — ABNORMAL LOW (ref 3.5–5.2)
Sodium: 143 mmol/L (ref 134–144)
Total Protein: 7 g/dL (ref 6.0–8.5)

## 2019-01-26 LAB — URINE CULTURE

## 2019-01-26 LAB — TSH: TSH: 1.56 u[IU]/mL (ref 0.450–4.500)

## 2019-01-31 ENCOUNTER — Ambulatory Visit
Admission: RE | Admit: 2019-01-31 | Discharge: 2019-01-31 | Disposition: A | Discharge status: Home / Self Care | Payer: BC Managed Care – PPO | Source: Ambulatory Visit | Attending: Family Medicine | Admitting: Family Medicine

## 2019-01-31 ENCOUNTER — Other Ambulatory Visit: Payer: Self-pay

## 2019-01-31 DIAGNOSIS — R109 Unspecified abdominal pain: Secondary | ICD-10-CM | POA: Diagnosis not present

## 2019-01-31 DIAGNOSIS — Z905 Acquired absence of kidney: Secondary | ICD-10-CM | POA: Diagnosis not present

## 2019-01-31 DIAGNOSIS — Z85528 Personal history of other malignant neoplasm of kidney: Secondary | ICD-10-CM | POA: Diagnosis not present

## 2019-02-01 ENCOUNTER — Telehealth: Payer: Self-pay

## 2019-02-01 NOTE — Telephone Encounter (Signed)
Patient advised.KW 

## 2019-02-01 NOTE — Telephone Encounter (Signed)
-----   Message from Jerrol Banana., MD sent at 02/01/2019 10:00 AM EDT ----- [place] normal.

## 2019-02-07 ENCOUNTER — Encounter: Payer: Self-pay | Admitting: Family Medicine

## 2019-02-07 ENCOUNTER — Other Ambulatory Visit: Payer: Self-pay

## 2019-02-07 ENCOUNTER — Ambulatory Visit (INDEPENDENT_AMBULATORY_CARE_PROVIDER_SITE_OTHER): Payer: BC Managed Care – PPO | Admitting: Family Medicine

## 2019-02-07 VITALS — BP 132/78 | HR 82 | Temp 98.9°F | Resp 16 | Ht 69.0 in | Wt 229.0 lb

## 2019-02-07 DIAGNOSIS — C642 Malignant neoplasm of left kidney, except renal pelvis: Secondary | ICD-10-CM

## 2019-02-07 DIAGNOSIS — N3090 Cystitis, unspecified without hematuria: Secondary | ICD-10-CM

## 2019-02-07 DIAGNOSIS — E6609 Other obesity due to excess calories: Secondary | ICD-10-CM

## 2019-02-07 DIAGNOSIS — Z6833 Body mass index (BMI) 33.0-33.9, adult: Secondary | ICD-10-CM

## 2019-02-07 DIAGNOSIS — R109 Unspecified abdominal pain: Secondary | ICD-10-CM | POA: Diagnosis not present

## 2019-02-07 LAB — POCT URINALYSIS DIPSTICK
Bilirubin, UA: NEGATIVE
Blood, UA: NEGATIVE
Glucose, UA: NEGATIVE
Ketones, UA: NEGATIVE
Leukocytes, UA: NEGATIVE
Nitrite, UA: NEGATIVE
Protein, UA: NEGATIVE
Spec Grav, UA: 1.01 (ref 1.010–1.025)
Urobilinogen, UA: 0.2 E.U./dL
pH, UA: 7 (ref 5.0–8.0)

## 2019-02-07 NOTE — Progress Notes (Signed)
Patient: Jaime Ramirez Female    DOB: June 07, 1951   68 y.o.   MRN: [id number] Visit Date: 02/07/2019  Today's Provider: [name], MD   Chief Complaint  Patient presents with  . Follow-up   Subjective:   HPI Patient comes in today for a follow up. She was last seen in the office 2 weeks ago c/o abdominal pain. She reports that her abdominal US came back normal. She reports that she does feel slightly better She was also treated for a UTI with Septra, and she has completed her treatment.  She had no discomfort for a full 6 days.  She has mild discomfort now in the right lower quadrant.  No other issues.  Ultrasound was benign.  She states this discomfort is been going on about 2 years.  No GI or GU symptoms. Allergies  Allergen Reactions  . Codeine Hives and Nausea Only  . Hydromorphone Hives     Current Outpatient Medications:  .  aspirin 81 MG tablet, Take by mouth., Disp: , Rfl:  .  hydrochlorothiazide (HYDRODIURIL) 25 MG tablet, TAKE 1 TABLET(25 MG) BY MOUTH EVERY MORNING, Disp: 30 tablet, Rfl: 12 .  naproxen sodium (ANAPROX) 220 MG tablet, Take 440 mg by mouth daily., Disp: , Rfl:  .  pantoprazole (PROTONIX) 40 MG tablet, Take 1 tablet (40 mg total) by mouth 2 (two) times daily., Disp: 60 tablet, Rfl: 12 .  pantoprazole (PROTONIX) 40 MG tablet, TAKE 1 TABLET BY MOUTH TWICE DAILY, Disp: 60 tablet, Rfl: 12 .  pantoprazole (PROTONIX) 40 MG tablet, TAKE 1 TABLET BY MOUTH TWICE DAILY, Disp: 60 tablet, Rfl: 12 .  losartan (COZAAR) 50 MG tablet, Take 1 tablet (50 mg total) by mouth daily. (Patient not taking: Reported on 01/13/2018), Disp: 90 tablet, Rfl: 3 .  ranitidine (ZANTAC) 300 MG tablet, TAKE 1 TABLET(300 MG) BY MOUTH DAILY (Patient not taking: Reported on 01/24/2019), Disp: 30 tablet, Rfl: 12  Review of Systems  Constitutional: Negative for activity change, appetite change, chills, fatigue and fever.  Respiratory: Negative for cough and shortness of breath.    Cardiovascular: Negative for chest pain, palpitations and leg swelling.  Gastrointestinal: Negative for abdominal pain, constipation, diarrhea, nausea, rectal pain and vomiting.  Genitourinary: Negative for dysuria, flank pain, frequency and urgency.  Neurological: Negative for dizziness and headaches.  Psychiatric/Behavioral: Negative for self-injury, sleep disturbance and suicidal ideas. The patient is not nervous/anxious.     Social History   Tobacco Use  . Smoking status: Never Smoker  . Smokeless tobacco: Never Used  Substance Use Topics  . Alcohol use: Yes    Alcohol/week: 0.0 standard drinks    Comment: 2 drinks a month      Objective:   BP 132/78 (BP Location: Right Arm, Patient Position: Sitting, Cuff Size: Large)   Pulse 82   Temp 98.9 F (37.2 C)   Resp 16   Ht 5\' 9"  (1.753 m)   Wt 229 lb (103.9 kg)   SpO2 98%   BMI 33.82 kg/m  Vitals:   02/07/19 0815  BP: 132/78  Pulse: 82  Resp: 16  Temp: 98.9 F (37.2 C)  SpO2: 98%  Weight: 229 lb (103.9 kg)  Height: 5\' 9"  (1.753 m)     Physical Exam Constitutional:      Appearance: She is obese.  Abdominal:     General: There is no distension.     Palpations: Abdomen is soft. There is no mass.  Tenderness: There is no abdominal tenderness. There is no guarding.  Neurological:     Mental Status: She is alert and oriented to person, place, and time.  Psychiatric:        Mood and Affect: Mood normal.        Thought Content: Thought content normal.        Judgment: Judgment normal.    UA negative     Assessment & Plan    1. Abdominal discomfort Negative ultrasound including pelvic ultrasound.  I am comfortable and patient feels comfortable no further work-up at this time.  Will use heating pad.  Refer to gynecology if this persists.  GI will be another possibility.  Try Metamucil daily. - POCT urinalysis dipstick  2. Class 1 obesity due to excess calories with serious comorbidity and body mass index  (BMI) of 33.0 to 33.9 in adult With hypertension and osteoarthritis RTC 6 months 3. Cystitis Resolved. 4.  Status post left nephrectomy for renal cell carcinoma years ago   I have done the exam and reviewed the above chart and it is accurate to the best of my knowledge. Development worker, community has been used in this note in any air is in the dictation or transcription are unintentional.  [name], MD  [place]

## 2019-05-18 ENCOUNTER — Other Ambulatory Visit: Payer: Self-pay | Admitting: Family Medicine

## 2019-05-18 DIAGNOSIS — R601 Generalized edema: Secondary | ICD-10-CM

## 2019-06-18 ENCOUNTER — Other Ambulatory Visit: Payer: Self-pay | Admitting: Family Medicine

## 2019-08-09 ENCOUNTER — Ambulatory Visit: Payer: Self-pay | Admitting: Family Medicine

## 2019-09-13 ENCOUNTER — Other Ambulatory Visit: Payer: Self-pay | Admitting: Family Medicine

## 2019-09-13 DIAGNOSIS — Z1231 Encounter for screening mammogram for malignant neoplasm of breast: Secondary | ICD-10-CM

## 2019-10-06 ENCOUNTER — Ambulatory Visit
Admission: RE | Admit: 2019-10-06 | Discharge: 2019-10-06 | Disposition: A | Discharge status: Home / Self Care | Payer: BC Managed Care – PPO | Source: Ambulatory Visit | Attending: Family Medicine | Admitting: Family Medicine

## 2019-10-06 DIAGNOSIS — Z1231 Encounter for screening mammogram for malignant neoplasm of breast: Secondary | ICD-10-CM | POA: Insufficient documentation

## 2019-12-07 DIAGNOSIS — M19072 Primary osteoarthritis, left ankle and foot: Secondary | ICD-10-CM | POA: Diagnosis not present

## 2020-01-24 ENCOUNTER — Ambulatory Visit: Payer: BC Managed Care – PPO | Admitting: Family Medicine

## 2020-01-24 NOTE — Progress Notes (Signed)
Complete physical exam  I,Shantinique Picazo,acting as a scribe for [name], MD.,have documented all relevant documentation on the behalf of [name], MD,as directed by  [name], MD while in the presence of [name], MD.   Patient: Jaime Ramirez   DOB: 1950/11/23   69 y.o. Female  MRN: [id number] Visit Date: 01/25/2020  Today's healthcare provider: [name], MD   Chief Complaint  Patient presents with  . Annual Exam   Subjective    Jaime Ramirez is a 69 y.o. female who presents today for a complete physical exam.  She reports consuming a general diet. Gym/ health club routine includes swimming and walking. She generally feels well. She reports sleeping fairly well. She does not have additional problems to discuss today.  She has a steady boyfriend for over 20 years.  She works for [employer] and enjoys it. HPI  Blood pressures at home running about 130/80 average.  She is little worried about a tremor that she has developed over the past few years.  Past Medical History:  Diagnosis Date  . Arthritis    right foot, left ankle  . Cancer ([place])    kidney  . Essential tremor   . GERD (gastroesophageal reflux disease)   . Vertigo    random episodes. none in about 1 month   Past Surgical History:  Procedure Laterality Date  . ANKLE SURGERY    . APPENDECTOMY    . BREAST BIOPSY Right 06/04/2015   demonstrating fibroadenomatous change with stromal hyalinization and calcifications  . CATARACT EXTRACTION W/PHACO Left 05/12/2017   Procedure: CATARACT EXTRACTION PHACO AND INTRAOCULAR LENS PLACEMENT (IOC)LEFT;  Surgeon: Leandrew Koyanagi, MD;  Location: [place];  Service: Ophthalmology;  Laterality: Left;  IVA TOPICAL LEFT  . CATARACT EXTRACTION W/PHACO Right 06/09/2017   Procedure: CATARACT EXTRACTION PHACO AND INTRAOCULAR LENS PLACEMENT ([place])  RIGHT;  Surgeon: Leandrew Koyanagi, MD;  Location: [place];  Service: Ophthalmology;  Laterality: Right;  . COLONOSCOPY WITH PROPOFOL N/A 02/21/2016   Procedure: COLONOSCOPY WITH PROPOFOL;  Surgeon: Lucilla Lame, MD;  Location: [place];  Service: Endoscopy;  Laterality: N/A;  . HERNIA REPAIR    . KIDNEY SURGERY Left    kidney removal  . TONSILLECTOMY AND ADENOIDECTOMY    . TOTAL KNEE ARTHROPLASTY Bilateral   . TUBAL LIGATION     Social History   Socioeconomic History  . Marital status: Divorced    Spouse name: Not on file  . Number of children: Not on file  . Years of education: Not on file  . Highest education level: Not on file  Occupational History  . Not on file  Tobacco Use  . Smoking status: Never Smoker  . Smokeless tobacco: Never Used  Substance and Sexual Activity  . Alcohol use: Yes    Alcohol/week: 0.0 standard drinks    Comment: 2 drinks a month  . Drug use: No  . Sexual activity: Not on file  Other Topics Concern  . Not on file  Social History Narrative  . Not on file   Social Determinants of Health   Financial Resource Strain:   . Difficulty of Paying Living Expenses:   Food Insecurity:   . Worried About Charity fundraiser in the Last Year:   . Arboriculturist in the Last Year:   Transportation Needs:   . Film/video editor (Medical):   Marland Kitchen Lack of Transportation (  Non-Medical):   Physical Activity:   . Days of Exercise per Week:   . Minutes of Exercise per Session:   Stress:   . Feeling of Stress :   Social Connections:   . Frequency of Communication with Friends and Family:   . Frequency of Social Gatherings with Friends and Family:   . Attends Religious Services:   . Active Member of Clubs or Organizations:   . Attends Archivist Meetings:   Marland Kitchen Marital Status:   Intimate Partner Violence:   . Fear of Current or Ex-Partner:   . Emotionally Abused:   Marland Kitchen Physically Abused:   . Sexually Abused:    Family Status  Relation Name Status  . Mother  Deceased  . Father  Deceased    . Sister  Alive  . Brother  Alive  . Daughter  Alive  . Mat Aunt  (Not Specified)  . [name]  (Not Specified)  . MGM  (Not Specified)  . MGF  (Not Specified)   Family History  Problem Relation Age of Onset  . Atrial fibrillation Mother   . Heart disease Mother        CHF  . Hypertension Father   . Kidney disease Father   . Lupus Sister   . Stroke Sister   . Heart disease Sister   . Breast cancer Maternal Aunt   . Breast cancer Paternal Aunt   . Brain cancer Paternal Aunt   . Stroke Maternal Grandmother   . Cancer Maternal Grandfather    Allergies  Allergen Reactions  . Codeine Hives and Nausea Only  . Hydromorphone Hives    Patient Care Team: Jerrol Banana., MD as PCP - General (Family Medicine)   Medications: Outpatient Medications Prior to Visit  Medication Sig  . aspirin 81 MG tablet Take by mouth.  . Cholecalciferol (VITAMIN D3 PO) Take by mouth.  . Cyanocobalamin (VITAMIN B-12 PO) Take by mouth.  . hydrochlorothiazide (HYDRODIURIL) 25 MG tablet TAKE 1 TABLET(25 MG) BY MOUTH EVERY MORNING  . naproxen sodium (ANAPROX) 220 MG tablet Take 440 mg by mouth daily.  . pantoprazole (PROTONIX) 40 MG tablet Take 1 tablet (40 mg total) by mouth 2 (two) times daily.  Marland Kitchen losartan (COZAAR) 50 MG tablet Take 1 tablet (50 mg total) by mouth daily. (Patient not taking: Reported on 01/13/2018)  . pantoprazole (PROTONIX) 40 MG tablet TAKE 1 TABLET BY MOUTH TWICE DAILY  . pantoprazole (PROTONIX) 40 MG tablet TAKE 1 TABLET BY MOUTH TWICE DAILY  . ranitidine (ZANTAC) 300 MG tablet TAKE 1 TABLET(300 MG) BY MOUTH DAILY (Patient not taking: Reported on 01/24/2019)   No facility-administered medications prior to visit.    Review of Systems  Constitutional: Negative.   HENT: Negative.   Eyes: Negative.   Respiratory: Negative.   Cardiovascular: Negative.   Gastrointestinal: Negative.   Endocrine: Negative.   Musculoskeletal: Negative.   Allergic/Immunologic: Negative.    Neurological: Positive for tremors.  Hematological: Negative.   Psychiatric/Behavioral: Negative.     Last hemoglobin A1c No results found for: HGBA1C    Objective    BP (!) 173/109 (BP Location: Right Arm, Cuff Size: Large)   Pulse 96   Temp (!) 96.8 F (36 C) (Other (Comment))   Resp 16   Ht 5\' 9"  (1.753 m)   Wt 231 lb (104.8 kg)   SpO2 97%   BMI 34.11 kg/m  BP Readings from Last 3 Encounters:  01/25/20 (!) 173/109  02/07/19 132/78  01/24/19 128/80   Wt Readings from Last 3 Encounters:  01/25/20 231 lb (104.8 kg)  02/07/19 229 lb (103.9 kg)  01/24/19 225 lb (102.1 kg)      Physical Exam Vitals and nursing note reviewed. Exam conducted with a chaperone present.  Constitutional:      Appearance: Normal appearance.  HENT:     Head:     Jaw: There is normal jaw occlusion.     Right Ear: Tympanic membrane normal.     Left Ear: Tympanic membrane normal.     Mouth/Throat:     Mouth: Mucous membranes are moist.     Pharynx: Oropharynx is clear.  Eyes:     Conjunctiva/sclera: Conjunctivae normal.  Cardiovascular:     Rate and Rhythm: Normal rate and regular rhythm.     Pulses: Normal pulses.     Heart sounds: Normal heart sounds.  Pulmonary:     Effort: Pulmonary effort is normal.     Breath sounds: Normal breath sounds.  Chest:     Breasts:        Right: Normal.        Left: Normal.  Abdominal:     General: Abdomen is flat. Bowel sounds are normal.     Palpations: Abdomen is soft.  Musculoskeletal:     Cervical back: Normal range of motion and neck supple.  Skin:    General: Skin is warm and dry.  Neurological:     General: No focal deficit present.     Mental Status: She is alert and oriented to person, place, and time.     Comments: Mild intention tremor of left greater than right hand.  Otherwise nonfocal neurologic exam.  Psychiatric:        Attention and Perception: Attention and perception normal.        Mood and Affect: Mood normal.         Speech: Speech normal.        Behavior: Behavior normal. Behavior is cooperative.        Thought Content: Thought content normal.        Cognition and Memory: Cognition and memory normal.        Judgment: Judgment normal.       Depression Screen  PHQ 2/9 Scores 01/25/2020 01/24/2019 01/13/2018  PHQ - 2 Score 0 0 0  PHQ- 9 Score 1 - -    No results found for any visits on 01/25/20.  Assessment & Plan    Routine Health Maintenance and Physical Exam  Exercise Activities and Dietary recommendations Goals   None     Immunization History  Administered Date(s) Administered  . PFIZER SARS-COV-2 Vaccination 08/28/2019, 09/18/2019  . Pneumococcal Conjugate-13 12/31/2015  . Pneumococcal Polysaccharide-23 01/05/2017  . Tdap 07/18/2012, 03/20/2014  . Zoster 11/28/2014    Health Maintenance  Topic Date Due  . DEXA SCAN  Never done  . INFLUENZA VACCINE  03/24/2020  . MAMMOGRAM  10/05/2021  . TETANUS/TDAP  03/20/2024  . COLONOSCOPY  02/20/2026  . COVID-19 Vaccine  Completed  . Hepatitis C Screening  Completed  . PNA vac Low Risk Adult  Completed    Discussed health benefits of physical activity, and encouraged her to engage in regular exercise appropriate for her age and condition.  1. Annual physical exam Pelvic exam next year. - TSH - Lipid panel - CBC w/Diff/Platelet - Comprehensive Metabolic Panel (CMET) - POCT urinalysis dipstick--Normal  2. Essential hypertension Patient to bring in her home blood pressure readings  and her cuff.  It appears she has whitecoat hypertension. - TSH - Lipid panel - CBC w/Diff/Platelet - Comprehensive Metabolic Panel (CMET)  3. Essential tremor  - TSH - Lipid panel - CBC w/Diff/Platelet - Comprehensive Metabolic Panel (CMET) - Ambulatory referral to Neurology   Return in 4 months (on 05/22/2020).     I, [name], MD, have reviewed all documentation for this visit. The documentation on 01/29/20 for the exam, diagnosis,  procedures, and orders are all accurate and complete.    Richard Cranford Mon, MD  [facility] [phone] (phone) [phone] (fax)  [place]

## 2020-01-25 ENCOUNTER — Ambulatory Visit (INDEPENDENT_AMBULATORY_CARE_PROVIDER_SITE_OTHER): Payer: BC Managed Care – PPO | Admitting: Family Medicine

## 2020-01-25 ENCOUNTER — Encounter: Payer: Self-pay | Admitting: Family Medicine

## 2020-01-25 ENCOUNTER — Other Ambulatory Visit: Payer: Self-pay

## 2020-01-25 VITALS — BP 173/109 | HR 96 | Temp 96.8°F | Resp 16 | Ht 69.0 in | Wt 231.0 lb

## 2020-01-25 DIAGNOSIS — I10 Essential (primary) hypertension: Secondary | ICD-10-CM

## 2020-01-25 DIAGNOSIS — G250 Essential tremor: Secondary | ICD-10-CM

## 2020-01-25 DIAGNOSIS — Z0000 Encounter for general adult medical examination without abnormal findings: Secondary | ICD-10-CM

## 2020-01-26 ENCOUNTER — Telehealth: Payer: Self-pay

## 2020-01-26 LAB — CBC WITH DIFFERENTIAL/PLATELET
Basophils Absolute: 0.1 10*3/uL (ref 0.0–0.2)
Basos: 1 %
EOS (ABSOLUTE): 0.2 10*3/uL (ref 0.0–0.4)
Eos: 2 %
Hematocrit: 42 % (ref 34.0–46.6)
Hemoglobin: 13.6 g/dL (ref 11.1–15.9)
Immature Grans (Abs): 0.1 10*3/uL (ref 0.0–0.1)
Immature Granulocytes: 1 %
Lymphocytes Absolute: 2 10*3/uL (ref 0.7–3.1)
Lymphs: 24 %
MCH: 28 pg (ref 26.6–33.0)
MCHC: 32.4 g/dL (ref 31.5–35.7)
MCV: 86 fL (ref 79–97)
Monocytes Absolute: 0.6 10*3/uL (ref 0.1–0.9)
Monocytes: 8 %
Neutrophils Absolute: 5.2 10*3/uL (ref 1.4–7.0)
Neutrophils: 64 %
Platelets: 314 10*3/uL (ref 150–450)
RBC: 4.86 x10E6/uL (ref 3.77–5.28)
RDW: 14.1 % (ref 11.7–15.4)
WBC: 8.1 10*3/uL (ref 3.4–10.8)

## 2020-01-26 LAB — LIPID PANEL
Chol/HDL Ratio: 3.1 ratio (ref 0.0–4.4)
Cholesterol, Total: 194 mg/dL (ref 100–199)
HDL: 63 mg/dL (ref >39)
LDL Chol Calc (NIH): 107 mg/dL — ABNORMAL HIGH (ref 0–99)
Triglycerides: 139 mg/dL (ref 0–149)
VLDL Cholesterol Cal: 24 mg/dL (ref 5–40)

## 2020-01-26 LAB — COMPREHENSIVE METABOLIC PANEL
ALT: 13 IU/L (ref 0–32)
AST: 17 IU/L (ref 0–40)
Albumin/Globulin Ratio: 1.5 (ref 1.2–2.2)
Albumin: 4.1 g/dL (ref 3.8–4.8)
Alkaline Phosphatase: 100 IU/L (ref 48–121)
BUN/Creatinine Ratio: 15 (ref 12–28)
BUN: 15 mg/dL (ref 8–27)
Bilirubin Total: 0.3 mg/dL (ref 0.0–1.2)
CO2: 24 mmol/L (ref 20–29)
Calcium: 9.7 mg/dL (ref 8.7–10.3)
Chloride: 100 mmol/L (ref 96–106)
Creatinine, Ser: 1.03 mg/dL — ABNORMAL HIGH (ref 0.57–1.00)
GFR calc Af Amer: 64 mL/min/{1.73_m2} (ref >59)
GFR calc non Af Amer: 56 mL/min/{1.73_m2} — ABNORMAL LOW (ref >59)
Globulin, Total: 2.8 g/dL (ref 1.5–4.5)
Glucose: 90 mg/dL (ref 65–99)
Potassium: 3.7 mmol/L (ref 3.5–5.2)
Sodium: 139 mmol/L (ref 134–144)
Total Protein: 6.9 g/dL (ref 6.0–8.5)

## 2020-01-26 LAB — TSH: TSH: 0.995 u[IU]/mL (ref 0.450–4.500)

## 2020-01-26 NOTE — Telephone Encounter (Signed)
-----   Message from Jerrol Banana., MD sent at 01/26/2020 10:51 AM EDT ----- Labs stable, continue to work on healthy lifestyle.

## 2020-01-26 NOTE — Telephone Encounter (Signed)
Patient given lab results through mychart and has read the providers comments.  

## 2020-01-29 ENCOUNTER — Encounter: Payer: Self-pay | Admitting: Family Medicine

## 2020-01-29 LAB — POCT URINALYSIS DIPSTICK
Appearance: NORMAL
Bilirubin, UA: NEGATIVE
Blood, UA: NEGATIVE
Glucose, UA: NEGATIVE
Ketones, UA: NEGATIVE
Leukocytes, UA: NEGATIVE
Nitrite, UA: NEGATIVE
Odor: NORMAL
Protein, UA: NEGATIVE
Spec Grav, UA: 1.01 (ref 1.010–1.025)
Urobilinogen, UA: 0.2 E.U./dL
pH, UA: 6.5 (ref 5.0–8.0)

## 2020-03-21 DIAGNOSIS — G250 Essential tremor: Secondary | ICD-10-CM | POA: Diagnosis not present

## 2020-03-21 DIAGNOSIS — E569 Vitamin deficiency, unspecified: Secondary | ICD-10-CM | POA: Diagnosis not present

## 2020-03-21 DIAGNOSIS — E538 Deficiency of other specified B group vitamins: Secondary | ICD-10-CM | POA: Diagnosis not present

## 2020-05-18 ENCOUNTER — Other Ambulatory Visit: Payer: Self-pay | Admitting: Family Medicine

## 2020-05-18 DIAGNOSIS — R601 Generalized edema: Secondary | ICD-10-CM

## 2020-05-18 NOTE — Telephone Encounter (Signed)
90 day courtesy RF Requested Prescriptions  Pending Prescriptions Disp Refills  . hydrochlorothiazide (HYDRODIURIL) 25 MG tablet [Pharmacy Med Name: HYDROCHLOROTHIAZIDE 25MG  TABLETS] 90 tablet 0    Sig: TAKE 1 TABLET(25 MG) BY MOUTH EVERY MORNING     Cardiovascular: Diuretics - Thiazide Failed - 05/18/2020 12:30 PM      Failed - Cr in normal range and within 360 days    Creat  Date Value Ref Range Status  06/17/2017 0.99 0.50 - 0.99 mg/dL Final    Comment:    For patients >1 years of age, the reference limit for Creatinine is approximately 13% higher for people identified as African-American. .    Creatinine, Ser  Date Value Ref Range Status  01/25/2020 1.03 (H) 0.57 - 1.00 mg/dL Final         Failed - Last BP in normal range    BP Readings from Last 1 Encounters:  01/25/20 (!) 173/109         Failed - Valid encounter within last 6 months    Recent Outpatient Visits          3 months ago Annual physical exam   [facility] Jerrol Banana., MD   1 year ago Abdominal discomfort   [facility] Jerrol Banana., MD   1 year ago Annual physical exam   [facility] Jerrol Banana., MD   1 year ago Essential hypertension   [facility] Jerrol Banana., MD   2 years ago Annual physical exam   [facility] Jerrol Banana., MD      Future Appointments            In 4 days Jerrol Banana., MD [facility] [place] in normal range and within 360 days    Calcium  Date Value Ref Range Status  01/25/2020 9.7 8.7 - 10.3 mg/dL Final   Calcium, Total  Date Value Ref Range Status  01/20/2014 9.2 8.5 - 10.1 mg/dL Final         Passed - K in normal range and within 360 days    Potassium  Date Value Ref Range Status  01/25/2020 3.7 3.5 - 5.2 mmol/L Final  01/20/2014 3.3 (L) 3.5 - 5.1 mmol/L Final         Passed - Na in normal  range and within 360 days    Sodium  Date Value Ref Range Status  01/25/2020 139 134 - 144 mmol/L Final  01/20/2014 139 136 - 145 mmol/L Final

## 2020-05-21 NOTE — Progress Notes (Signed)
I,[name],acting as a scribe for [name], MD.,have documented all relevant documentation on the behalf of [name], MD,as directed by  [name], MD while in the presence of [name], MD.   Established patient visit   Patient: Jaime Ramirez   DOB: [birth year]   69 y.o. Female  MRN: [id number] Visit Date: 05/22/2020  Today's healthcare provider: [name], MD   Chief Complaint  Patient presents with  . Follow-up  . Hypertension   Subjective    HPI  Overall the patient has been doing well but blood pressure tends to be a little up recently. She has 2 other complaints, 1 is her hands and feet are tingly and itching at times.  Also right greater than left heel has thickening in peeling of the skin to the point where it is painful. Hypertension, follow-up  BP Readings from Last 3 Encounters:  05/22/20 (!) 152/91  01/25/20 (!) 173/109  02/07/19 132/78   Wt Readings from Last 3 Encounters:  05/22/20 238 lb (108 kg)  01/25/20 231 lb (104.8 kg)  02/07/19 229 lb (103.9 kg)     She was last seen for hypertension 3 months ago.  BP at that visit was 173/109. Management since that visit includes; Patient to bring in her home blood pressure readings and her cuff.  It appears she has whitecoat hypertension. She reports good compliance with treatment. She is not having side effects. none She is exercising. She is adherent to low salt diet.   Outside blood pressures are 123/69, 136/67.  She does not smoke.  Use of agents associated with hypertension: none.   --------------------------------------------------------------------  Patient also wants to discuss tingling/itchy feeling in her hans and feet. Patient states this has slowly been progressing for a couple of months. Patient also states her left foot feels like it has the feeling of going to sleep while she's walking occasionally.      Medications: Outpatient  Medications Prior to Visit  Medication Sig  . aspirin 81 MG tablet Take by mouth.  . Cholecalciferol (VITAMIN D3 PO) Take by mouth.  . Cyanocobalamin (VITAMIN B-12 PO) Take by mouth.  . naproxen sodium (ANAPROX) 220 MG tablet Take 440 mg by mouth daily.  . pantoprazole (PROTONIX) 40 MG tablet TAKE 1 TABLET BY MOUTH TWICE DAILY  . propranolol ER (INDERAL LA) 60 MG 24 hr capsule Take 60 mg by mouth daily.  . [DISCONTINUED] hydrochlorothiazide (HYDRODIURIL) 25 MG tablet TAKE 1 TABLET(25 MG) BY MOUTH EVERY MORNING  . losartan (COZAAR) 50 MG tablet Take 1 tablet (50 mg total) by mouth daily. (Patient not taking: Reported on 01/13/2018)  . pantoprazole (PROTONIX) 40 MG tablet Take 1 tablet (40 mg total) by mouth 2 (two) times daily. (Patient not taking: Reported on 05/22/2020)  . pantoprazole (PROTONIX) 40 MG tablet TAKE 1 TABLET BY MOUTH TWICE DAILY (Patient not taking: Reported on 05/22/2020)  . ranitidine (ZANTAC) 300 MG tablet TAKE 1 TABLET(300 MG) BY MOUTH DAILY (Patient not taking: Reported on 01/24/2019)   No facility-administered medications prior to visit.    Review of Systems  Constitutional: Negative for appetite change, chills, fatigue and fever.  Respiratory: Negative for chest tightness and shortness of breath.   Cardiovascular: Negative for chest pain and palpitations.  Gastrointestinal: Negative for abdominal pain, nausea and vomiting.  Neurological: Negative for dizziness and weakness.      Objective    BP (!) 152/91 (BP Location: Left Arm, Patient Position:  Sitting, Cuff Size: Large)   Pulse 60   Temp 98.1 F (36.7 C) (Oral)   Resp 16   Ht 5\' 9"  (1.753 m)   Wt 238 lb (108 kg)   SpO2 97%   BMI 35.15 kg/m    Physical Exam Vitals reviewed.  Constitutional:      Appearance: She is well-developed.  HENT:     Head: Normocephalic and atraumatic.     Right Ear: External ear normal.     Left Ear: External ear normal.     Nose: Nose normal.  Eyes:      Conjunctiva/sclera: Conjunctivae normal.  Neck:     Thyroid: No thyromegaly.  Cardiovascular:     Rate and Rhythm: Normal rate and regular rhythm.     Heart sounds: Normal heart sounds.  Pulmonary:     Effort: Pulmonary effort is normal.     Breath sounds: Normal breath sounds.  Abdominal:     Palpations: Abdomen is soft.  Musculoskeletal:     Cervical back: Neck supple.     Comments: Chronic lymphedema  Skin:    General: Skin is warm and dry.     Comments: Right heel greater than left heel has thickening of the sole of the foot with breaking of the skin and tenderness of this area.  No signs of cellulitis or infection.  Neurological:     General: No focal deficit present.     Mental Status: She is alert and oriented to person, place, and time.     Comments: Negative Tinel's sign bilaterally  Psychiatric:        Mood and Affect: Mood normal.        Behavior: Behavior normal.        Thought Content: Thought content normal.        Judgment: Judgment normal.     BP (!) 152/91 (BP Location: Left Arm, Patient Position: Sitting, Cuff Size: Large)   Pulse 60   Temp 98.1 F (36.7 C) (Oral)   Resp 16   Ht 5\' 9"  (1.753 m)   Wt 238 lb (108 kg)   SpO2 97%   BMI 35.15 kg/m     No results found for any visits on 05/22/20.  Assessment & Plan     1. Essential hypertension Change HCTZ to chlorthalidone for longer half-life. - chlorthalidone (HYGROTON) 25 MG tablet; Take 1 tablet (25 mg total) by mouth daily.  Dispense: 30 tablet; Refill: 12  2. Dry skin May need referral to dermatology or podiatry for this problem with the heels. - mometasone (ELOCON) 0.1 % ointment; Apply topically daily.  Dispense: 45 g; Refill: 0  3. Tingling in extremities Consider getting B12 level although exam today is normal.  4. Gastroesophageal reflux disease, unspecified whether esophagitis present On chronic pantoprazole  5.h/o Renal cell carcinoma of left kidney ([place])   6. Lymphedema of both  lower extremities   7. Class 1 obesity due to excess calories with serious comorbidity and body mass index (BMI) of 33.0 to 33.9 in adult With hypertension and GERD    Return in about 4 months (around 09/21/2020).         Jaziah Kwasnik Cranford Mon, MD  [facility] [phone] (phone) [phone] (fax)  [place]

## 2020-05-22 ENCOUNTER — Encounter: Payer: Self-pay | Admitting: Family Medicine

## 2020-05-22 ENCOUNTER — Other Ambulatory Visit: Payer: Self-pay

## 2020-05-22 ENCOUNTER — Ambulatory Visit: Payer: BC Managed Care – PPO | Admitting: Family Medicine

## 2020-05-22 VITALS — BP 152/91 | HR 60 | Temp 98.1°F | Resp 16 | Ht 69.0 in | Wt 238.0 lb

## 2020-05-22 DIAGNOSIS — C642 Malignant neoplasm of left kidney, except renal pelvis: Secondary | ICD-10-CM

## 2020-05-22 DIAGNOSIS — L853 Xerosis cutis: Secondary | ICD-10-CM

## 2020-05-22 DIAGNOSIS — I890 Lymphedema, not elsewhere classified: Secondary | ICD-10-CM

## 2020-05-22 DIAGNOSIS — I10 Essential (primary) hypertension: Secondary | ICD-10-CM | POA: Diagnosis not present

## 2020-05-22 DIAGNOSIS — R202 Paresthesia of skin: Secondary | ICD-10-CM | POA: Diagnosis not present

## 2020-05-22 DIAGNOSIS — Z6833 Body mass index (BMI) 33.0-33.9, adult: Secondary | ICD-10-CM

## 2020-05-22 DIAGNOSIS — K219 Gastro-esophageal reflux disease without esophagitis: Secondary | ICD-10-CM

## 2020-05-22 DIAGNOSIS — E6609 Other obesity due to excess calories: Secondary | ICD-10-CM

## 2020-05-22 MED ORDER — CHLORTHALIDONE 25 MG PO TABS: 25.0000 mg | ORAL_TABLET | Freq: Every day | ORAL | 12 refills | Status: DC

## 2020-05-22 MED ORDER — MOMETASONE FUROATE 0.1 % EX OINT: TOPICAL_OINTMENT | Freq: Every day | CUTANEOUS | 0 refills | Status: DC

## 2020-05-22 NOTE — Patient Instructions (Addendum)
Discontinue hydrochlorothiazide and start chlorthalidone 25 mg.

## 2020-05-31 ENCOUNTER — Encounter: Payer: Self-pay | Admitting: Family Medicine

## 2020-06-04 DIAGNOSIS — G250 Essential tremor: Secondary | ICD-10-CM | POA: Diagnosis not present

## 2020-06-10 DIAGNOSIS — R682 Dry mouth, unspecified: Secondary | ICD-10-CM | POA: Diagnosis not present

## 2020-06-10 DIAGNOSIS — H6123 Impacted cerumen, bilateral: Secondary | ICD-10-CM | POA: Diagnosis not present

## 2020-07-24 ENCOUNTER — Other Ambulatory Visit: Payer: Self-pay | Admitting: Family Medicine

## 2020-07-24 NOTE — Telephone Encounter (Signed)
Requested medications are due for refill today yes  Requested medications are on the active medication list yes  Last refill 8/24  Last visit 04/2020  Future visit scheduled 09/2020  Notes to clinic Has three current rx for Protonix so caution of multiple rx for same med is activated.

## 2020-08-29 ENCOUNTER — Ambulatory Visit: Payer: BC Managed Care – PPO | Admitting: Family Medicine

## 2020-08-30 DIAGNOSIS — M7661 Achilles tendinitis, right leg: Secondary | ICD-10-CM | POA: Diagnosis not present

## 2020-09-11 ENCOUNTER — Other Ambulatory Visit: Payer: Self-pay | Admitting: Family Medicine

## 2020-09-11 DIAGNOSIS — Z1231 Encounter for screening mammogram for malignant neoplasm of breast: Secondary | ICD-10-CM

## 2020-09-24 ENCOUNTER — Ambulatory Visit: Payer: Self-pay | Admitting: Family Medicine

## 2020-10-05 ENCOUNTER — Other Ambulatory Visit: Payer: Self-pay | Admitting: Family Medicine

## 2020-10-05 DIAGNOSIS — R601 Generalized edema: Secondary | ICD-10-CM

## 2020-10-07 ENCOUNTER — Other Ambulatory Visit: Payer: Self-pay

## 2020-10-07 ENCOUNTER — Ambulatory Visit
Admission: RE | Admit: 2020-10-07 | Discharge: 2020-10-07 | Disposition: A | Discharge status: Home / Self Care | Payer: BC Managed Care – PPO | Source: Ambulatory Visit | Attending: Family Medicine | Admitting: Family Medicine

## 2020-10-07 DIAGNOSIS — Z1231 Encounter for screening mammogram for malignant neoplasm of breast: Secondary | ICD-10-CM | POA: Diagnosis not present

## 2020-10-07 NOTE — Telephone Encounter (Signed)
Copied from [place] [id number]. Topic: General - Inquiry >> Oct 07, 2020  9:43 AM Greggory Keen D wrote: Reason for CRM: Pt wants to talk to a nurse about a rx that Dr. Rosanna Randy took her off of back in September.  The  rx is hydrochlorothiazide.  She would like to go back on this medication.  CB#  [phone]

## 2020-10-09 NOTE — Telephone Encounter (Signed)
Patient requesting refill for HCTZ, she reports she stopped taking Chlorthalidone in November due extreme dry mouth, causing her to not be able to eat. Patient reports she restarted HCTZ in December and has been taking HCTZ since then daily and reports good tolerance and blood pressure control.

## 2020-10-10 MED ORDER — HYDROCHLOROTHIAZIDE 25 MG PO TABS: 25.0000 mg | ORAL_TABLET | Freq: Every day | ORAL | 3 refills | Status: DC

## 2020-10-26 ENCOUNTER — Other Ambulatory Visit: Payer: Self-pay | Admitting: Family Medicine

## 2020-10-26 NOTE — Telephone Encounter (Signed)
Requested Prescriptions  Pending Prescriptions Disp Refills  . pantoprazole (PROTONIX) 40 MG tablet [Pharmacy Med Name: PANTOPRAZOLE 40MG  TABLETS] 180 tablet 0    Sig: TAKE 1 TABLET BY MOUTH TWICE DAILY     Gastroenterology: Proton Pump Inhibitors Passed - 10/26/2020 10:06 AM      Passed - Valid encounter within last 12 months    Recent Outpatient Visits          5 months ago Essential hypertension   [facility] Jerrol Banana., MD   9 months ago Annual physical exam   [facility] Jerrol Banana., MD   1 year ago Abdominal discomfort   [facility] Jerrol Banana., MD   1 year ago Annual physical exam   [facility] Jerrol Banana., MD   2 years ago Essential hypertension   [facility] Jerrol Banana., MD      Future Appointments            In 2 months Ralene Bathe, MD [place]

## 2020-12-29 IMAGING — MG DIGITAL SCREENING BILATERAL MAMMOGRAM WITH TOMO AND CAD
8 series · 8 of 24 positions shown · non-contrast
Comparison: Previous exam(s).

CLINICAL DATA: Screening.

EXAM:
DIGITAL SCREENING BILATERAL MAMMOGRAM WITH TOMO AND CAD

[L CC synth-2D]
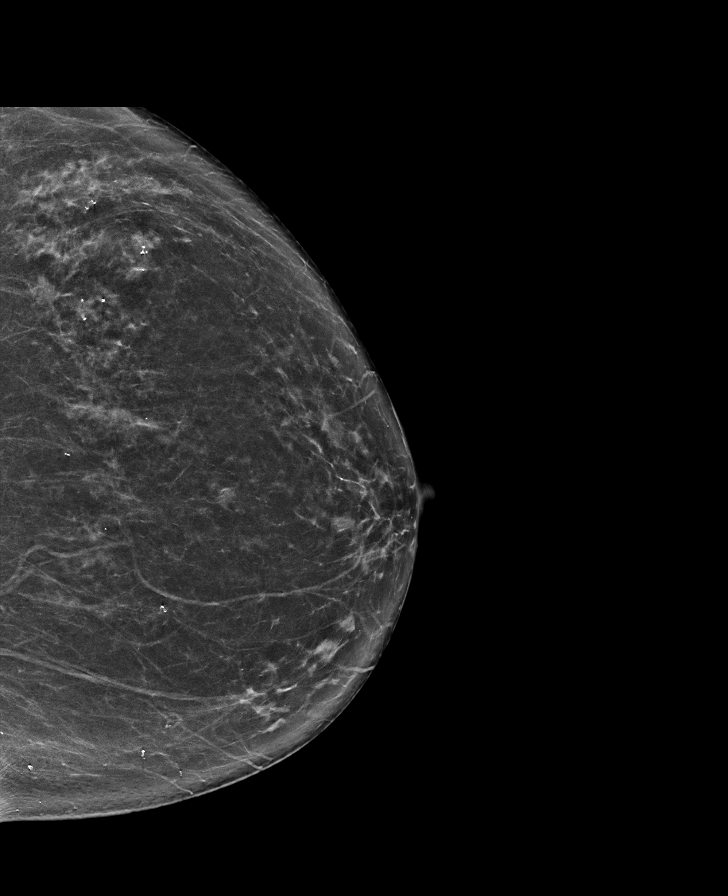

[R CC synth-2D]
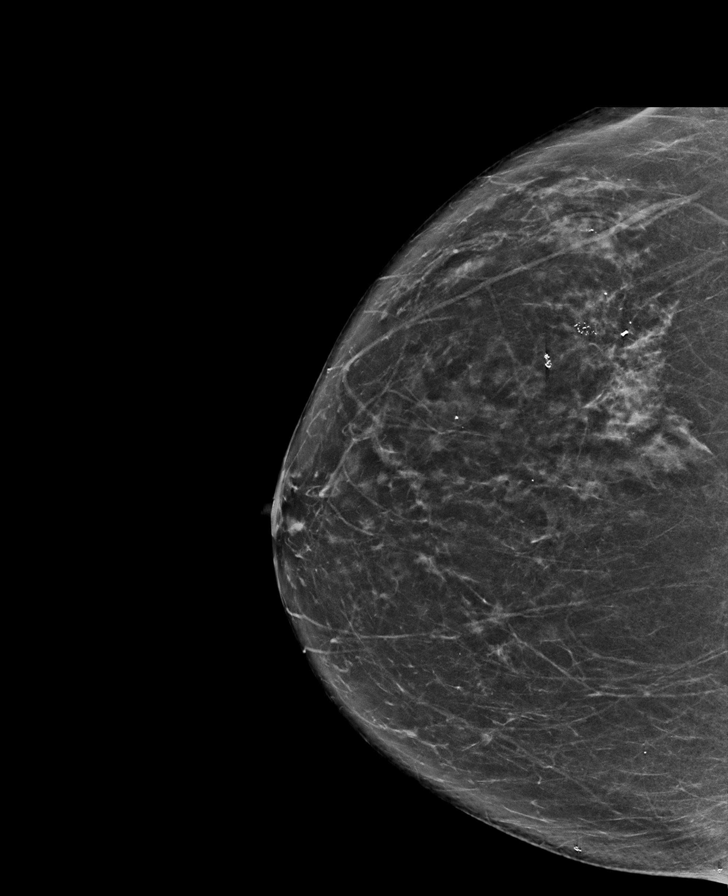

[L MLO synth-2D]
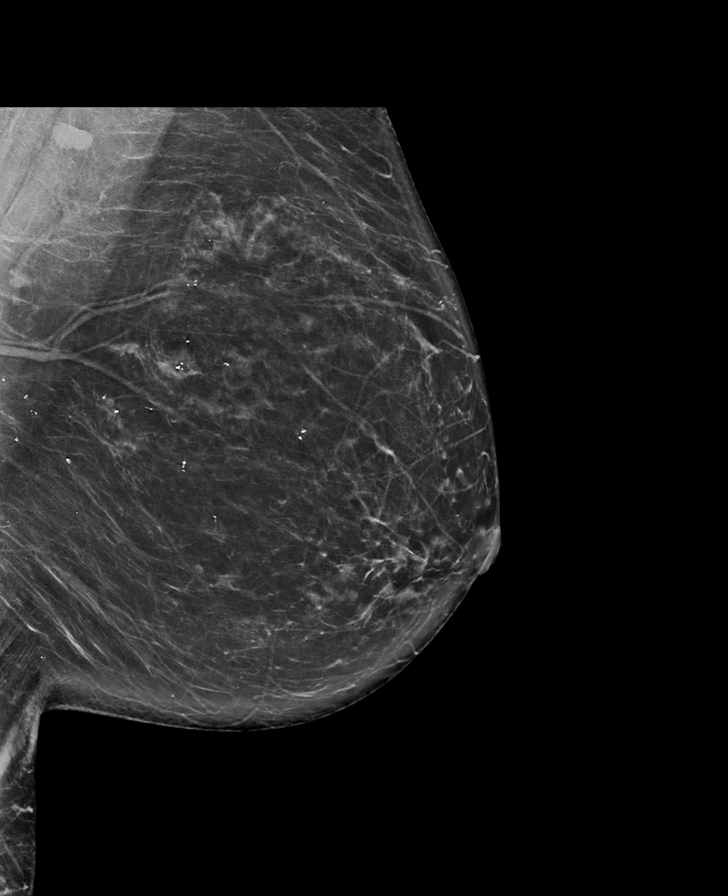

[R MLO synth-2D]
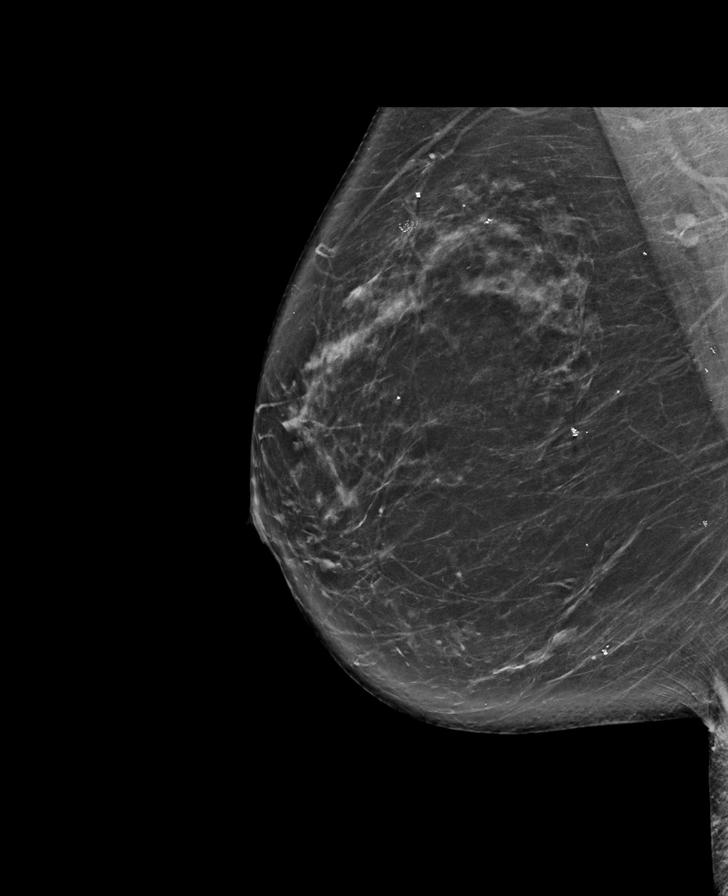

[L CC tomo · tomo slice 37/73.0]
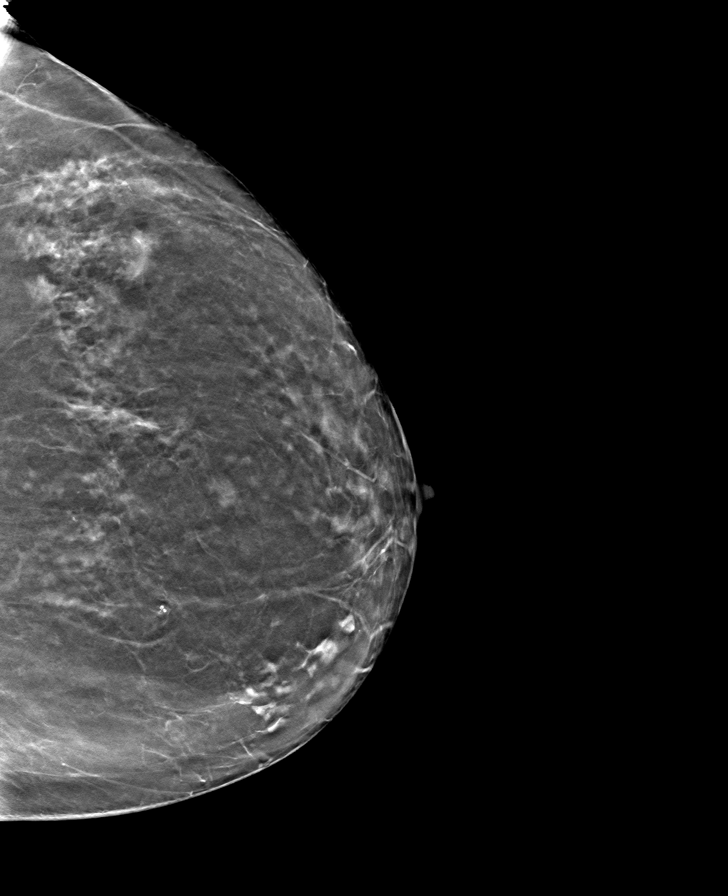

[L MLO tomo · tomo slice 43/84.0]
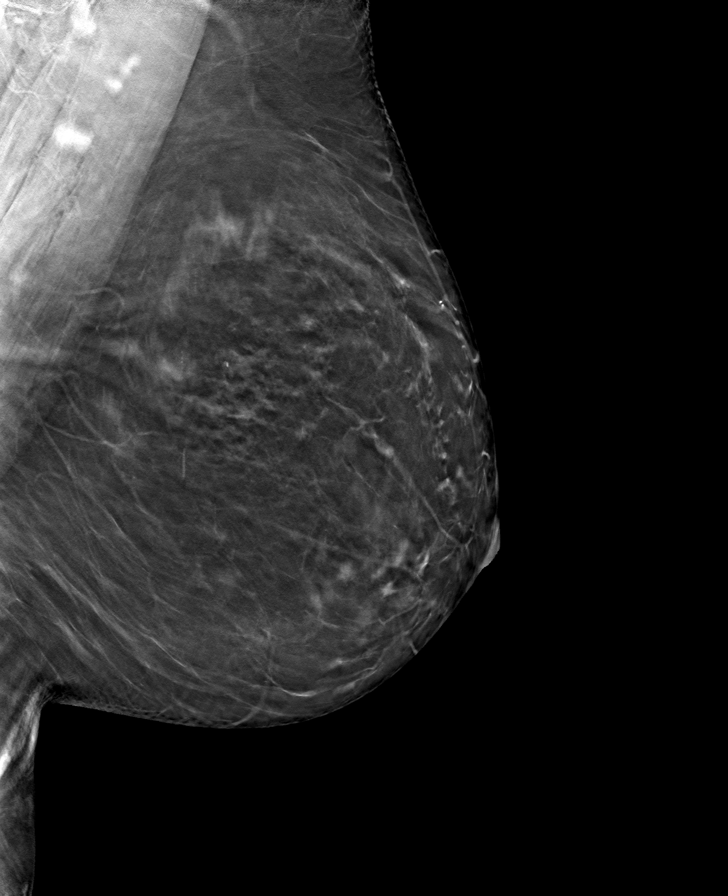

[R CC tomo · tomo slice 37/74.0]
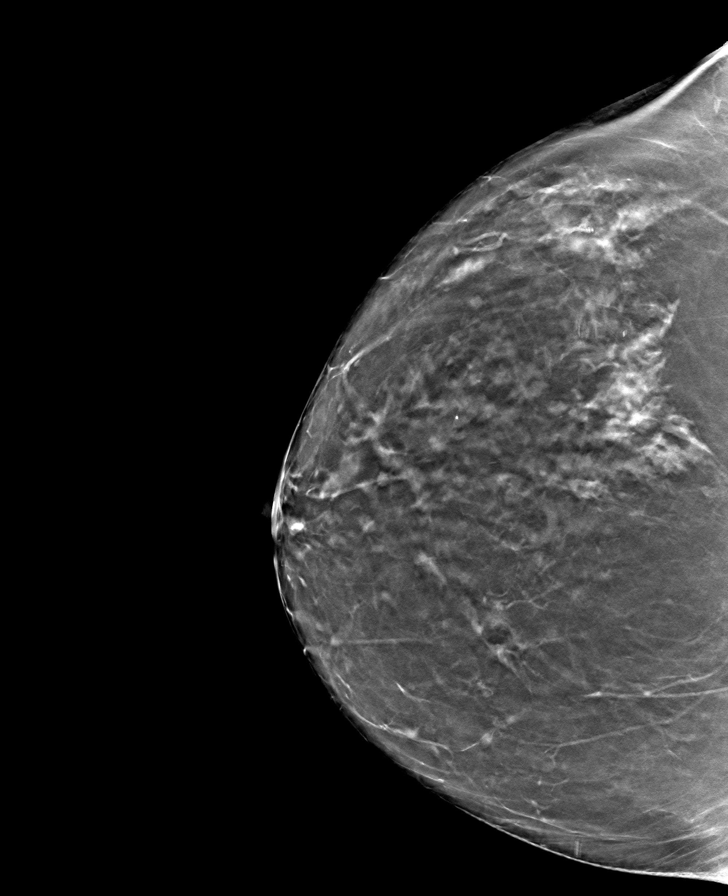

[R MLO tomo · tomo slice 46/91.0]
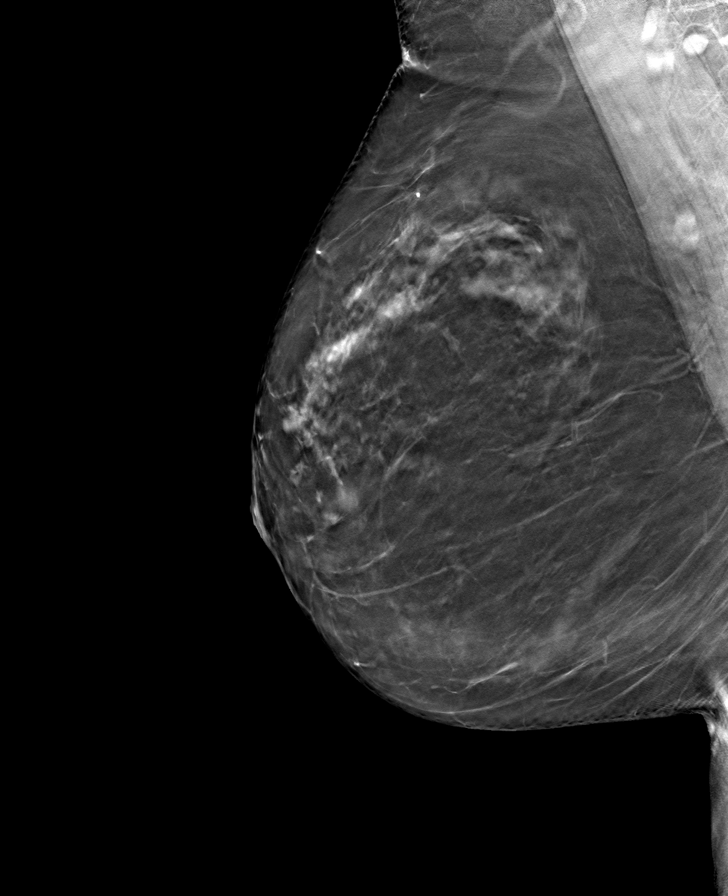

[8 of 24 positions shown; findings below may reference images not displayed]

ACR Breast Density Category b: There are scattered areas of
fibroglandular density.
FINDINGS: There are no findings suspicious for malignancy. Images were
processed with CAD.
IMPRESSION: No mammographic evidence of malignancy. A result letter of this
screening mammogram will be mailed directly to the patient.

RECOMMENDATION:
Screening mammogram in one year. (Code:[id number])

BI-RADS CATEGORY  1: Negative.

## 2020-12-30 ENCOUNTER — Ambulatory Visit: Payer: BC Managed Care – PPO | Admitting: Dermatology

## 2020-12-30 ENCOUNTER — Other Ambulatory Visit: Payer: Self-pay

## 2020-12-30 DIAGNOSIS — D229 Melanocytic nevi, unspecified: Secondary | ICD-10-CM

## 2020-12-30 DIAGNOSIS — L820 Inflamed seborrheic keratosis: Secondary | ICD-10-CM | POA: Diagnosis not present

## 2020-12-30 DIAGNOSIS — L578 Other skin changes due to chronic exposure to nonionizing radiation: Secondary | ICD-10-CM

## 2020-12-30 DIAGNOSIS — L720 Epidermal cyst: Secondary | ICD-10-CM | POA: Diagnosis not present

## 2020-12-30 DIAGNOSIS — L814 Other melanin hyperpigmentation: Secondary | ICD-10-CM

## 2020-12-30 NOTE — Patient Instructions (Addendum)

## 2020-12-30 NOTE — Progress Notes (Signed)
   New Patient Visit  Subjective  Jaime Ramirez is a 70 y.o. female who presents for the following: Skin Problem (Patient has a few spots at face she would like checked. Two of the posts are crusty and two of them have a white bump under the skin. Spots have been there for a few years. No hx of skin cancer. ).  The following portions of the chart were reviewed this encounter and updated as appropriate:   Tobacco  Allergies  Meds  Problems  Med Hx  Surg Hx  Fam Hx     Review of Systems:  No other skin or systemic complaints except as noted in HPI or Assessment and Plan.  Objective  Well appearing patient in no apparent distress; mood and affect are within normal limits.  A focused examination was performed including face, hands, back. Relevant physical exam findings are noted in the Assessment and Plan.  Objective  left forehead x 1, mid forehead x 1, right cheek x 1, right dorsum hand x 1 (3): Erythematous keratotic or waxy stuck-on papule or plaque.   Objective  Right Upper Eyelid, left chin: Smooth white papule(s).    Assessment & Plan  Inflamed seborrheic keratosis (3) left forehead x 1, mid forehead x 1, right cheek x 1, right dorsum hand x 1  Destruction of lesion - left forehead x 1, mid forehead x 1, right cheek x 1, right dorsum hand x 1 Complexity: simple   Destruction method: cryotherapy   Informed consent: discussed and consent obtained   Timeout:  patient name, date of birth, surgical site, and procedure verified Lesion destroyed using liquid nitrogen: Yes   Region frozen until ice ball extended beyond lesion: Yes   Outcome: patient tolerated procedure well with no complications   Post-procedure details: wound care instructions given    [place] Right Upper Eyelid, left chin  Start [place] QHS. Consider extraction if not resolving with topicals.   Actinic Damage - chronic, secondary to cumulative UV radiation exposure/sun exposure over time - diffuse  scaly erythematous macules with underlying dyspigmentation - Recommend daily broad spectrum sunscreen SPF 30+ to sun-exposed areas, reapply every 2 hours as needed.  - Recommend staying in the shade or wearing long sleeves, sun glasses (UVA+UVB protection) and wide brim hats (4-inch brim around the entire circumference of the hat). - Call for new or changing lesions.  Lentigines - Scattered tan macules - Due to sun exposure - Benign-appering, observe - Recommend daily broad spectrum sunscreen SPF 30+ to sun-exposed areas, reapply every 2 hours as needed. - Call for any changes  Melanocytic Nevi - Tan-brown and/or pink-flesh-colored symmetric macules and papules - Benign appearing on exam today - Observation - Call clinic for new or changing moles - Recommend daily use of broad spectrum spf 30+ sunscreen to sun-exposed areas.   Return in about 4 months (around 05/02/2021) for TBSE.  Graciella Belton, RMA, am acting as scribe for [name], MD . Documentation: I have reviewed the above documentation for accuracy and completeness, and I agree with the above.  [name], MD

## 2020-12-31 ENCOUNTER — Encounter: Payer: Self-pay | Admitting: Dermatology

## 2021-01-07 DIAGNOSIS — H8112 Benign paroxysmal vertigo, left ear: Secondary | ICD-10-CM | POA: Diagnosis not present

## 2021-01-07 DIAGNOSIS — R42 Dizziness and giddiness: Secondary | ICD-10-CM | POA: Diagnosis not present

## 2021-01-14 DIAGNOSIS — H8112 Benign paroxysmal vertigo, left ear: Secondary | ICD-10-CM | POA: Diagnosis not present

## 2021-01-15 DIAGNOSIS — H8112 Benign paroxysmal vertigo, left ear: Secondary | ICD-10-CM | POA: Diagnosis not present

## 2021-01-22 DIAGNOSIS — H8112 Benign paroxysmal vertigo, left ear: Secondary | ICD-10-CM | POA: Diagnosis not present

## 2021-01-27 ENCOUNTER — Other Ambulatory Visit: Payer: Self-pay | Admitting: Family Medicine

## 2021-02-05 ENCOUNTER — Ambulatory Visit (INDEPENDENT_AMBULATORY_CARE_PROVIDER_SITE_OTHER): Payer: BC Managed Care – PPO | Admitting: Family Medicine

## 2021-02-05 ENCOUNTER — Other Ambulatory Visit: Payer: Self-pay

## 2021-02-05 ENCOUNTER — Encounter: Payer: Self-pay | Admitting: Family Medicine

## 2021-02-05 VITALS — BP 167/84 | HR 84 | Temp 98.7°F | Resp 16 | Ht 69.0 in | Wt 231.0 lb

## 2021-02-05 DIAGNOSIS — C642 Malignant neoplasm of left kidney, except renal pelvis: Secondary | ICD-10-CM

## 2021-02-05 DIAGNOSIS — E6609 Other obesity due to excess calories: Secondary | ICD-10-CM

## 2021-02-05 DIAGNOSIS — Z0000 Encounter for general adult medical examination without abnormal findings: Secondary | ICD-10-CM | POA: Diagnosis not present

## 2021-02-05 DIAGNOSIS — I10 Essential (primary) hypertension: Secondary | ICD-10-CM

## 2021-02-05 DIAGNOSIS — E781 Pure hyperglyceridemia: Secondary | ICD-10-CM

## 2021-02-05 DIAGNOSIS — R202 Paresthesia of skin: Secondary | ICD-10-CM

## 2021-02-05 DIAGNOSIS — K219 Gastro-esophageal reflux disease without esophagitis: Secondary | ICD-10-CM

## 2021-02-05 DIAGNOSIS — Z6833 Body mass index (BMI) 33.0-33.9, adult: Secondary | ICD-10-CM

## 2021-02-05 NOTE — Progress Notes (Signed)
I,Jaime Ramirez,acting as a scribe for Jaime Durie, MD.,have documented all relevant documentation on the behalf of Jaime Durie, MD,as directed by  Jaime Durie, MD while in the presence of Jaime Durie, MD.   Complete physical exam   Patient: Jaime Ramirez   DOB: 1951/03/07   70 y.o. Female  MRN: [id number] Visit Date: 02/05/2021  Today's healthcare provider: [name], MD   Chief Complaint  Patient presents with   Annual Exam   Subjective    Jaime Ramirez is a 70 y.o. female who presents today for a complete physical exam.  She reports consuming a general diet. Home exercise routine includes biking. She generally feels well. She reports sleeping well. She does not have additional problems to discuss today.  She continues to work for [employer] and has for several years.  No immediate plans to retire. HPI    Past Medical History:  Diagnosis Date   Arthritis    right foot, left ankle   Cancer ([place])    kidney   Essential tremor    GERD (gastroesophageal reflux disease)    Vertigo    random episodes. none in about 1 month   Past Surgical History:  Procedure Laterality Date   ANKLE SURGERY     APPENDECTOMY     BREAST BIOPSY Right 06/04/2015   demonstrating fibroadenomatous change with stromal hyalinization and calcifications   CATARACT EXTRACTION W/PHACO Left 05/12/2017   Procedure: CATARACT EXTRACTION PHACO AND INTRAOCULAR LENS PLACEMENT (IOC)LEFT;  Surgeon: Leandrew Koyanagi, MD;  Location: [place];  Service: Ophthalmology;  Laterality: Left;  IVA TOPICAL LEFT   CATARACT EXTRACTION W/PHACO Right 06/09/2017   Procedure: CATARACT EXTRACTION PHACO AND INTRAOCULAR LENS PLACEMENT ([place])  RIGHT;  Surgeon: Leandrew Koyanagi, MD;  Location: [place];  Service: Ophthalmology;  Laterality: Right;   COLONOSCOPY WITH PROPOFOL N/A 02/21/2016   Procedure: COLONOSCOPY WITH PROPOFOL;  Surgeon: Lucilla Lame, MD;   Location: [place];  Service: Endoscopy;  Laterality: N/A;   HERNIA REPAIR     KIDNEY SURGERY Left    kidney removal   TONSILLECTOMY AND ADENOIDECTOMY     TOTAL KNEE ARTHROPLASTY Bilateral    TUBAL LIGATION     Social History   Socioeconomic History   Marital status: Divorced    Spouse name: Not on file   Number of children: Not on file   Years of education: Not on file   Highest education level: Not on file  Occupational History   Not on file  Tobacco Use   Smoking status: Never   Smokeless tobacco: Never  Vaping Use   Vaping Use: Never used  Substance and Sexual Activity   Alcohol use: Yes    Alcohol/week: 0.0 standard drinks    Comment: 2 drinks a month   Drug use: No   Sexual activity: Not on file  Other Topics Concern   Not on file  Social History Narrative   Not on file   Social Determinants of Health   Financial Resource Strain: Not on file  Food Insecurity: Not on file  Transportation Needs: Not on file  Physical Activity: Not on file  Stress: Not on file  Social Connections: Not on file  Intimate Partner Violence: Not on file   Family Status  Relation Name Status   Mother  Deceased   Father  Deceased   Sister  Alive   Brother  Alive   Daughter  Alive  Mat Aunt  (Not Specified)   Ethlyn Ramirez  (Not Specified)   MGM  (Not Specified)   MGF  (Not Specified)   Family History  Problem Relation Age of Onset   Atrial fibrillation Mother    Heart disease Mother        CHF   Hypertension Father    Kidney disease Father    Lupus Sister    Stroke Sister    Heart disease Sister    Breast cancer Maternal Aunt    Breast cancer Paternal Aunt    Brain cancer Paternal Aunt    Stroke Maternal Grandmother    Cancer Maternal Grandfather    Allergies  Allergen Reactions   Codeine Hives and Nausea Only   Hydromorphone Hives    Patient Care Team: Jerrol Banana., MD as PCP - General (Family Medicine)   Medications: Outpatient  Medications Prior to Visit  Medication Sig   aspirin 81 MG tablet Take by mouth.   Cholecalciferol (VITAMIN D3 PO) Take by mouth.   Cyanocobalamin (VITAMIN B-12 PO) Take by mouth.   hydrochlorothiazide (HYDRODIURIL) 25 MG tablet Take 1 tablet (25 mg total) by mouth daily.   naproxen sodium (ANAPROX) 220 MG tablet Take 440 mg by mouth daily.   pantoprazole (PROTONIX) 40 MG tablet TAKE 1 TABLET BY MOUTH TWICE DAILY   chlorthalidone (HYGROTON) 25 MG tablet Take 1 tablet (25 mg total) by mouth daily. (Patient not taking: Reported on 02/05/2021)   losartan (COZAAR) 50 MG tablet Take 1 tablet (50 mg total) by mouth daily. (Patient not taking: No sig reported)   mometasone (ELOCON) 0.1 % ointment Apply topically daily. (Patient not taking: Reported on 02/05/2021)   propranolol ER (INDERAL LA) 60 MG 24 hr capsule Take 60 mg by mouth daily. (Patient not taking: No sig reported)   ranitidine (ZANTAC) 300 MG tablet TAKE 1 TABLET(300 MG) BY MOUTH DAILY (Patient not taking: No sig reported)   No facility-administered medications prior to visit.    Review of Systems  Musculoskeletal:  Positive for arthralgias.  Neurological:  Positive for dizziness and tremors.  All other systems reviewed and are negative.     Objective    BP (!) 167/84 (BP Location: Right Arm, Patient Position: Sitting, Cuff Size: Large)   Pulse 84   Temp 98.7 F (37.1 C) (Oral)   Resp 16   Ht 5\' 9"  (1.753 m)   Wt 231 lb (104.8 kg)   SpO2 96%   BMI 34.11 kg/m  BP Readings from Last 3 Encounters:  02/05/21 (!) 167/84  05/22/20 (!) 152/91  01/25/20 (!) 173/109   Wt Readings from Last 3 Encounters:  02/05/21 231 lb (104.8 kg)  05/22/20 238 lb (108 kg)  01/25/20 231 lb (104.8 kg)      Physical Exam Vitals and nursing note reviewed. Exam conducted with a chaperone present.  Constitutional:      Appearance: Normal appearance.  HENT:     Head:     Jaw: There is normal jaw occlusion.     Right Ear: Tympanic membrane  normal.     Left Ear: Tympanic membrane normal.     Mouth/Throat:     Mouth: Mucous membranes are moist.     Pharynx: Oropharynx is clear.  Eyes:     Conjunctiva/sclera: Conjunctivae normal.  Cardiovascular:     Rate and Rhythm: Normal rate and regular rhythm.     Pulses: Normal pulses.     Heart sounds: Normal heart sounds.  Pulmonary:     Effort: Pulmonary effort is normal.     Breath sounds: Normal breath sounds.  Chest:  Breasts:    Right: Normal.     Left: Normal.  Abdominal:     General: Abdomen is flat. Bowel sounds are normal.     Palpations: Abdomen is soft.  Musculoskeletal:     Cervical back: Normal range of motion and neck supple.  Skin:    General: Skin is warm and dry.  Neurological:     General: No focal deficit present.     Mental Status: She is alert and oriented to person, place, and time.     Comments: Mild intention tremor of left greater than right hand.  Otherwise nonfocal neurologic exam.  Psychiatric:        Attention and Perception: Attention and perception normal.        Mood and Affect: Mood normal.        Speech: Speech normal.        Behavior: Behavior normal. Behavior is cooperative.        Thought Content: Thought content normal.        Cognition and Memory: Cognition and memory normal.        Judgment: Judgment normal.      Last depression screening scores PHQ 2/9 Scores 02/05/2021 05/22/2020 01/25/2020  PHQ - 2 Score 0 0 0  PHQ- 9 Score 1 4 1    Last fall risk screening Fall Risk  02/05/2021  Falls in the past year? 0  Number falls in past yr: 0  Comment -  Injury with Fall? 0  Comment -  Risk for fall due to : Impaired balance/gait  Follow up Falls evaluation completed   Last Audit-C alcohol use screening Alcohol Use Disorder Test (AUDIT) 02/05/2021  1. How often do you have a drink containing alcohol? 0  2. How many drinks containing alcohol do you have on a typical day when you are drinking? 0  3. How often do you have six or  more drinks on one occasion? 0  AUDIT-C Score 0  Alcohol Brief Interventions/Follow-up -   A score of 3 or more in women, and 4 or more in men indicates increased risk for alcohol abuse, EXCEPT if all of the points are from question 1   No results found for any visits on 02/05/21.  Assessment & Plan    Routine Health Maintenance and Physical Exam  Exercise Activities and Dietary recommendations  Goals   None     Immunization History  Administered Date(s) Administered   PFIZER(Purple Top)SARS-COV-2 Vaccination 08/28/2019, 09/18/2019   Pneumococcal Conjugate-13 12/31/2015   Pneumococcal Polysaccharide-23 01/05/2017   Tdap 07/18/2012, 03/20/2014   Zoster, Live 11/28/2014    Health Maintenance  Topic Date Due   Zoster Vaccines- Shingrix (1 of 2) Never done   DEXA SCAN  Never done   COVID-19 Vaccine (3 - Pfizer risk series) 10/16/2019   INFLUENZA VACCINE  03/24/2021   MAMMOGRAM  10/07/2022   TETANUS/TDAP  03/20/2024   COLONOSCOPY (Pts 45-65yrs Insurance coverage will need to be confirmed)  02/20/2026   Hepatitis C Screening  Completed   PNA vac Low Risk Adult  Completed   HPV VACCINES  Aged Out    Discussed health benefits of physical activity, and encouraged her to engage in regular exercise appropriate for her age and condition.  1. Annual physical exam  - POCT urinalysis dipstick - DG Bone Density; Future  2. Essential hypertension Whitecoat hypertension.  Blood pressures at home are well controlled.  Bring in those numbers on next visit. - CBC with Differential/Platelet - Comprehensive metabolic panel - TSH  3. H/o Renal cell carcinoma of left kidney ([facility])   4. Class 1 obesity due to excess calories with serious comorbidity and body mass index (BMI) of 33.0 to 33.9 in adult   5. Gastroesophageal reflux disease, unspecified whether esophagitis present   6. Tingling in extremities Check B12/folate/A1c/TSH  7. Pure hypertriglyceridemia  - Lipid panel -  TSH   Return in about 6 months (around 08/07/2021).     I, Jaime Durie, MD, have reviewed all documentation for this visit. The documentation on 02/12/21 for the exam, diagnosis, procedures, and orders are all accurate and complete.    Litha Lamartina Cranford Mon, MD  [facility] [phone] (phone) [phone] (fax)  [place]

## 2021-02-06 LAB — CBC WITH DIFFERENTIAL/PLATELET
Basophils Absolute: 0 10*3/uL (ref 0.0–0.2)
Basos: 1 %
EOS (ABSOLUTE): 0.3 10*3/uL (ref 0.0–0.4)
Eos: 6 %
Hematocrit: 42.7 % (ref 34.0–46.6)
Hemoglobin: 13.8 g/dL (ref 11.1–15.9)
Immature Grans (Abs): 0 10*3/uL (ref 0.0–0.1)
Immature Granulocytes: 1 %
Lymphocytes Absolute: 1.4 10*3/uL (ref 0.7–3.1)
Lymphs: 26 %
MCH: 28.2 pg (ref 26.6–33.0)
MCHC: 32.3 g/dL (ref 31.5–35.7)
MCV: 87 fL (ref 79–97)
Monocytes Absolute: 0.5 10*3/uL (ref 0.1–0.9)
Monocytes: 9 %
Neutrophils Absolute: 3.1 10*3/uL (ref 1.4–7.0)
Neutrophils: 57 %
Platelets: 272 10*3/uL (ref 150–450)
RBC: 4.89 x10E6/uL (ref 3.77–5.28)
RDW: 13.7 % (ref 11.7–15.4)
WBC: 5.3 10*3/uL (ref 3.4–10.8)

## 2021-02-06 LAB — COMPREHENSIVE METABOLIC PANEL
ALT: 15 IU/L (ref 0–32)
AST: 20 IU/L (ref 0–40)
Albumin/Globulin Ratio: 1.4 (ref 1.2–2.2)
Albumin: 4.3 g/dL (ref 3.8–4.8)
Alkaline Phosphatase: 93 IU/L (ref 44–121)
BUN/Creatinine Ratio: 17 (ref 12–28)
BUN: 18 mg/dL (ref 8–27)
Bilirubin Total: 0.4 mg/dL (ref 0.0–1.2)
CO2: 21 mmol/L (ref 20–29)
Calcium: 9.7 mg/dL (ref 8.7–10.3)
Chloride: 103 mmol/L (ref 96–106)
Creatinine, Ser: 1.07 mg/dL — ABNORMAL HIGH (ref 0.57–1.00)
Globulin, Total: 3 g/dL (ref 1.5–4.5)
Glucose: 94 mg/dL (ref 65–99)
Potassium: 4.1 mmol/L (ref 3.5–5.2)
Sodium: 142 mmol/L (ref 134–144)
Total Protein: 7.3 g/dL (ref 6.0–8.5)
eGFR: 56 mL/min/{1.73_m2} — ABNORMAL LOW (ref >59)

## 2021-02-06 LAB — LIPID PANEL
Chol/HDL Ratio: 3.3 ratio (ref 0.0–4.4)
Cholesterol, Total: 190 mg/dL (ref 100–199)
HDL: 58 mg/dL (ref >39)
LDL Chol Calc (NIH): 111 mg/dL — ABNORMAL HIGH (ref 0–99)
Triglycerides: 116 mg/dL (ref 0–149)
VLDL Cholesterol Cal: 21 mg/dL (ref 5–40)

## 2021-02-06 LAB — TSH: TSH: 1.02 u[IU]/mL (ref 0.450–4.500)

## 2021-04-23 DIAGNOSIS — G250 Essential tremor: Secondary | ICD-10-CM | POA: Diagnosis not present

## 2021-04-23 DIAGNOSIS — M810 Age-related osteoporosis without current pathological fracture: Secondary | ICD-10-CM | POA: Diagnosis not present

## 2021-04-23 DIAGNOSIS — Z131 Encounter for screening for diabetes mellitus: Secondary | ICD-10-CM | POA: Diagnosis not present

## 2021-04-23 DIAGNOSIS — R202 Paresthesia of skin: Secondary | ICD-10-CM | POA: Diagnosis not present

## 2021-04-26 ENCOUNTER — Other Ambulatory Visit: Payer: Self-pay | Admitting: Family Medicine

## 2021-04-27 NOTE — Telephone Encounter (Signed)
Requested medication (s) are due for refill today: yes  Requested medication (s) are on the active medication list: yes  Last refill:  01/27/21  Future visit scheduled: yes  Notes to clinic:  Note attached to last refill-"must attend upcoming visit prior to further refills. Pt will run out of med before Dec. Appt- pt needs enough to last until appt.   Requested Prescriptions  Pending Prescriptions Disp Refills   pantoprazole (PROTONIX) 40 MG tablet [Pharmacy Med Name: PANTOPRAZOLE '40MG'$  TABLETS] 180 tablet 0    Sig: TAKE 1 TABLET BY MOUTH TWICE DAILY     Gastroenterology: Proton Pump Inhibitors Passed - 04/26/2021  7:51 PM      Passed - Valid encounter within last 12 months    Recent Outpatient Visits           2 months ago Annual physical exam   [facility] Jerrol Banana., MD   11 months ago Essential hypertension   [facility] Jerrol Banana., MD   1 year ago Annual physical exam   [facility] Jerrol Banana., MD   2 years ago Abdominal discomfort   [facility] Jerrol Banana., MD   2 years ago Annual physical exam   [facility] Jerrol Banana., MD       Future Appointments             In 3 weeks Ralene Bathe, MD [place]   In 3 months Jerrol Banana., MD [facility] [place]

## 2021-04-29 NOTE — Telephone Encounter (Signed)
Pt calling in stating that she is completely out of this medication. She states that she did not get to take it last night or this morning. Please advise .

## 2021-05-19 ENCOUNTER — Other Ambulatory Visit: Payer: Self-pay

## 2021-05-19 ENCOUNTER — Ambulatory Visit: Payer: BC Managed Care – PPO | Admitting: Dermatology

## 2021-05-19 DIAGNOSIS — Z1283 Encounter for screening for malignant neoplasm of skin: Secondary | ICD-10-CM

## 2021-05-19 DIAGNOSIS — L578 Other skin changes due to chronic exposure to nonionizing radiation: Secondary | ICD-10-CM

## 2021-05-19 DIAGNOSIS — L409 Psoriasis, unspecified: Secondary | ICD-10-CM

## 2021-05-19 DIAGNOSIS — L814 Other melanin hyperpigmentation: Secondary | ICD-10-CM

## 2021-05-19 DIAGNOSIS — L821 Other seborrheic keratosis: Secondary | ICD-10-CM

## 2021-05-19 DIAGNOSIS — D229 Melanocytic nevi, unspecified: Secondary | ICD-10-CM | POA: Diagnosis not present

## 2021-05-19 DIAGNOSIS — D1800 Hemangioma unspecified site: Secondary | ICD-10-CM

## 2021-05-19 MED ORDER — WYNZORA 0.005-0.064 % EX CREA: 1.0000 "application " | TOPICAL_CREAM | CUTANEOUS | 3 refills | Status: DC

## 2021-05-19 NOTE — Patient Instructions (Signed)

## 2021-05-19 NOTE — Progress Notes (Signed)
   Follow-Up Visit   Subjective  Jaime Ramirez is a 70 y.o. female who presents for the following: Annual Exam (TBSE today) and Other (Dryness of feet). The patient presents for Total-Body Skin Exam (TBSE) for skin cancer screening and mole check.  The following portions of the chart were reviewed this encounter and updated as appropriate:   Tobacco  Allergies  Meds  Problems  Med Hx  Surg Hx  Fam Hx     Review of Systems:  No other skin or systemic complaints except as noted in HPI or Assessment and Plan.  Objective  Well appearing patient in no apparent distress; mood and affect are within normal limits.  A full examination was performed including scalp, head, eyes, ears, nose, lips, neck, chest, axillae, abdomen, back, buttocks, bilateral upper extremities, bilateral lower extremities, hands, feet, fingers, toes, fingernails, and toenails. All findings within normal limits unless otherwise noted below.  Left elbow, feet, hands Scaly patches                Assessment & Plan   Lentigines - Scattered tan macules - Due to sun exposure - Benign-appearing, observe - Recommend daily broad spectrum sunscreen SPF 30+ to sun-exposed areas, reapply every 2 hours as needed. - Call for any changes  Seborrheic Keratoses - Stuck-on, waxy, tan-brown papules and/or plaques  - Benign-appearing - Discussed benign etiology and prognosis. - Observe - Call for any changes  Melanocytic Nevi - Tan-brown and/or pink-flesh-colored symmetric macules and papules - Benign appearing on exam today - Observation - Call clinic for new or changing moles - Recommend daily use of broad spectrum spf 30+ sunscreen to sun-exposed areas.   Hemangiomas - Red papules - Discussed benign nature - Observe - Call for any changes  Actinic Damage - Chronic condition, secondary to cumulative UV/sun exposure - diffuse scaly erythematous macules with underlying dyspigmentation - Recommend  daily broad spectrum sunscreen SPF 30+ to sun-exposed areas, reapply every 2 hours as needed.  - Staying in the shade or wearing long sleeves, sun glasses (UVA+UVB protection) and wide brim hats (4-inch brim around the entire circumference of the hat) are also recommended for sun protection.  - Call for new or changing lesions.  Skin cancer screening performed today.  Psoriasis Left elbow, feet, hands  Psoriasis is a chronic non-curable, but treatable genetic/hereditary disease that may have other systemic features affecting other organ systems such as joints (Psoriatic Arthritis). It is associated with an increased risk of inflammatory bowel disease, heart disease, non-alcoholic fatty liver disease, and depression.    BSA - 12%  Start Wynzora cream bid x 2 weeks then qd 5 times per week May consider other topical, oral or systemic injection treatment in the future.  Related Medications Calcipotriene-Betameth Diprop (WYNZORA) 0.005-0.064 % CREA Apply 1 application topically as directed. To hands, feet and left elbow twice daily for 2 weeks then qd 5 times per week  Skin cancer screening  Return in about 4 months (around 09/18/2021).  I, Ashok Cordia, CMA, am acting as scribe for [name], MD . Documentation: I have reviewed the above documentation for accuracy and completeness, and I agree with the above.  [name], MD

## 2021-05-22 ENCOUNTER — Encounter: Payer: Self-pay | Admitting: Dermatology

## 2021-06-05 DIAGNOSIS — M19072 Primary osteoarthritis, left ankle and foot: Secondary | ICD-10-CM | POA: Diagnosis not present

## 2021-07-28 ENCOUNTER — Other Ambulatory Visit: Payer: Self-pay | Admitting: Family Medicine

## 2021-07-30 DIAGNOSIS — Z8612 Personal history of poliomyelitis: Secondary | ICD-10-CM | POA: Diagnosis not present

## 2021-07-30 DIAGNOSIS — G250 Essential tremor: Secondary | ICD-10-CM | POA: Diagnosis not present

## 2021-07-30 DIAGNOSIS — R202 Paresthesia of skin: Secondary | ICD-10-CM | POA: Diagnosis not present

## 2021-08-07 ENCOUNTER — Ambulatory Visit: Payer: Self-pay | Admitting: Family Medicine

## 2021-08-29 DIAGNOSIS — G250 Essential tremor: Secondary | ICD-10-CM | POA: Diagnosis not present

## 2021-09-10 ENCOUNTER — Other Ambulatory Visit: Payer: Self-pay | Admitting: Family Medicine

## 2021-09-10 DIAGNOSIS — Z1231 Encounter for screening mammogram for malignant neoplasm of breast: Secondary | ICD-10-CM

## 2021-09-15 DIAGNOSIS — H6123 Impacted cerumen, bilateral: Secondary | ICD-10-CM | POA: Diagnosis not present

## 2021-09-15 DIAGNOSIS — H902 Conductive hearing loss, unspecified: Secondary | ICD-10-CM | POA: Diagnosis not present

## 2021-09-18 ENCOUNTER — Ambulatory Visit (INDEPENDENT_AMBULATORY_CARE_PROVIDER_SITE_OTHER): Payer: BC Managed Care – PPO | Admitting: Dermatology

## 2021-09-18 ENCOUNTER — Other Ambulatory Visit: Payer: Self-pay

## 2021-09-18 DIAGNOSIS — L409 Psoriasis, unspecified: Secondary | ICD-10-CM | POA: Diagnosis not present

## 2021-09-18 DIAGNOSIS — L720 Epidermal cyst: Secondary | ICD-10-CM | POA: Diagnosis not present

## 2021-09-18 MED ORDER — VTAMA 1 % EX CREA: 1.0000 "application " | TOPICAL_CREAM | Freq: Every day | CUTANEOUS | 2 refills | Status: DC

## 2021-09-18 NOTE — Progress Notes (Signed)
° °  Follow-Up Visit   Subjective  Jaime Ramirez is a 71 y.o. female who presents for the following: Psoriasis (4 months f/u on psoriasis on the hands, elbows and feet treating with Delos Haring with a poor response.  ). Check a spot on upper right eyelid will not go away.   The following portions of the chart were reviewed this encounter and updated as appropriate:   Tobacco   Allergies   Meds   Problems   Med Hx   Surg Hx   Fam Hx      Review of Systems:  No other skin or systemic complaints except as noted in HPI or Assessment and Plan.  Objective  Well appearing patient in no apparent distress; mood and affect are within normal limits.  A focused examination was performed including hands, left elbow, feet . Relevant physical exam findings are noted in the Assessment and Plan.  hands, left elbow and feet Scaly patches   right upper eyelid Smooth white papule   Assessment & Plan  Psoriasis hands, left elbow and feet  Psoriasis is a chronic non-curable, but treatable genetic/hereditary disease that may have other systemic features affecting other organ systems such as joints (Psoriatic Arthritis). It is associated with an increased risk of inflammatory bowel disease, heart disease, non-alcoholic fatty liver disease, and depression.     D/C Delos Haring   Start Vtama cream apply to affected skin once a day   Related Medications Tapinarof (VTAMA) 1 % CREA Apply 1 application topically daily.  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  Patient would like removed.  Bruising possible after removal   Incision and Drainage/extraction/destruction- right upper eyelid margin Location: right upper eyelid margin  Informed Consent: Discussed risks (permanent scarring, light or dark discoloration, infection, pain, bleeding, bruising, redness, damage to adjacent structures, and recurrence of the lesion) and benefits of the procedure, as well as the alternatives.   Informed consent was obtained.  Preparation: The area was prepped with alcohol.  Anesthesia: Lidocaine 1% with epinephrine  Procedure Details: An incision was made overlying the lesion. The lesion drained pus.  A small amount of fluid was drained.    Antibiotic ointment and a sterile pressure dressing were applied. The patient tolerated procedure well.  Total number of lesions drained: 1  Plan: The patient was instructed on post-op care. Recommend OTC analgesia as needed for pain.  Return in about 6 weeks (around 10/30/2021) for Psoriasis .  IMarye Round, CMA, am acting as scribe for [name], MD .  Documentation: I have reviewed the above documentation for accuracy and completeness, and I agree with the above.  [name], MD

## 2021-09-18 NOTE — Patient Instructions (Addendum)

## 2021-09-22 ENCOUNTER — Encounter: Payer: Self-pay | Admitting: Dermatology

## 2021-10-09 ENCOUNTER — Other Ambulatory Visit: Payer: Self-pay | Admitting: Family Medicine

## 2021-10-09 NOTE — Telephone Encounter (Signed)
Requested medication (s) are due for refill today: yes  Requested medication (s) are on the active medication list: yes  Last refill:  10/10/20 #90 with 3 RF  Future visit scheduled: 02/09/22, was asked at 02/05/21 visit to return in 6 months, Dec. Next visit scheduled 02/09/22  Notes to clinic:  Failed protocol due to no valid visit within 6  months, please assess.  Requested Prescriptions  Pending Prescriptions Disp Refills   hydrochlorothiazide (HYDRODIURIL) 25 MG tablet [Pharmacy Med Name: HYDROCHLOROTHIAZIDE 25MG  TABLETS] 90 tablet 3    Sig: TAKE 1 TABLET(25 MG) BY MOUTH DAILY     Cardiovascular: Diuretics - Thiazide Failed - 10/09/2021 10:29 AM      Failed - Cr in normal range and within 180 days    Creat  Date Value Ref Range Status  06/17/2017 0.99 0.50 - 0.99 mg/dL Final    Comment:    For patients >44 years of age, the reference limit for Creatinine is approximately 13% higher for people identified as African-American. .    Creatinine, Ser  Date Value Ref Range Status  02/05/2021 1.07 (H) 0.57 - 1.00 mg/dL Final          Failed - K in normal range and within 180 days    Potassium  Date Value Ref Range Status  02/05/2021 4.1 3.5 - 5.2 mmol/L Final  01/20/2014 3.3 (L) 3.5 - 5.1 mmol/L Final          Failed - Na in normal range and within 180 days    Sodium  Date Value Ref Range Status  02/05/2021 142 134 - 144 mmol/L Final  01/20/2014 139 136 - 145 mmol/L Final          Failed - Last BP in normal range    BP Readings from Last 1 Encounters:  02/05/21 (!) 167/84          Failed - Valid encounter within last 6 months    Recent Outpatient Visits           8 months ago Annual physical exam   [facility] Jerrol Banana., MD   1 year ago Essential hypertension   [facility] Jerrol Banana., MD   1 year ago Annual physical exam   [facility] Jerrol Banana., MD   2 years ago Abdominal  discomfort   [facility] Jerrol Banana., MD   2 years ago Annual physical exam   [facility] Jerrol Banana., MD       Future Appointments             In 4 weeks Ralene Bathe, MD [place]   In 4 months Jerrol Banana., MD [facility]

## 2021-10-14 ENCOUNTER — Other Ambulatory Visit: Payer: Self-pay | Admitting: Family Medicine

## 2021-10-14 ENCOUNTER — Ambulatory Visit
Admission: RE | Admit: 2021-10-14 | Discharge: 2021-10-14 | Disposition: A | Discharge status: Home / Self Care | Payer: BC Managed Care – PPO | Source: Ambulatory Visit | Attending: Family Medicine | Admitting: Family Medicine

## 2021-10-14 ENCOUNTER — Other Ambulatory Visit: Payer: Self-pay

## 2021-10-14 DIAGNOSIS — R921 Mammographic calcification found on diagnostic imaging of breast: Secondary | ICD-10-CM

## 2021-10-14 DIAGNOSIS — Z1231 Encounter for screening mammogram for malignant neoplasm of breast: Secondary | ICD-10-CM | POA: Diagnosis not present

## 2021-10-14 DIAGNOSIS — R928 Other abnormal and inconclusive findings on diagnostic imaging of breast: Secondary | ICD-10-CM

## 2021-10-21 ENCOUNTER — Other Ambulatory Visit: Payer: Self-pay | Admitting: Family Medicine

## 2021-10-22 NOTE — Telephone Encounter (Signed)
Requested Prescriptions  ?Pending Prescriptions Disp Refills  ?? pantoprazole (PROTONIX) 40 MG tablet [Pharmacy Med Name: PANTOPRAZOLE 40MG  TABLETS] 180 tablet 0  ?  Sig: TAKE 1 TABLET BY MOUTH TWICE DAILY  ?  ? Gastroenterology: Proton Pump Inhibitors Failed - 10/21/2021  8:58 PM  ?  ?  Failed - Valid encounter within last 12 months  ?  Recent Outpatient Visits   ?      ? 8 months ago Annual physical exam  ? [facility] Jerrol Banana., MD  ? 1 year ago Essential hypertension  ? [facility] Jerrol Banana., MD  ? 1 year ago Annual physical exam  ? [facility] Jerrol Banana., MD  ? 2 years ago Abdominal discomfort  ? [facility] Jerrol Banana., MD  ? 2 years ago Annual physical exam  ? [facility] Jerrol Banana., MD  ?  ?  ?Future Appointments   ?        ? In 1 month Ralene Bathe, MD [place]  ? In 3 months Jerrol Banana., MD [facility]  ?  ? ?  ?  ?  ? ? ?

## 2021-10-30 ENCOUNTER — Other Ambulatory Visit: Payer: Self-pay | Admitting: Family Medicine

## 2021-10-30 ENCOUNTER — Ambulatory Visit
Admission: RE | Admit: 2021-10-30 | Discharge: 2021-10-30 | Disposition: A | Discharge status: Home / Self Care | Payer: BC Managed Care – PPO | Source: Ambulatory Visit | Attending: Family Medicine | Admitting: Family Medicine

## 2021-10-30 ENCOUNTER — Other Ambulatory Visit: Payer: Self-pay

## 2021-10-30 DIAGNOSIS — R928 Other abnormal and inconclusive findings on diagnostic imaging of breast: Secondary | ICD-10-CM | POA: Diagnosis not present

## 2021-10-30 DIAGNOSIS — R921 Mammographic calcification found on diagnostic imaging of breast: Secondary | ICD-10-CM | POA: Diagnosis not present

## 2021-10-30 DIAGNOSIS — R922 Inconclusive mammogram: Secondary | ICD-10-CM | POA: Diagnosis not present

## 2021-11-04 ENCOUNTER — Encounter: Payer: Self-pay | Admitting: Family Medicine

## 2021-11-06 ENCOUNTER — Ambulatory Visit: Payer: BC Managed Care – PPO | Admitting: Dermatology

## 2021-11-06 ENCOUNTER — Ambulatory Visit: Payer: BC Managed Care – PPO | Admitting: Family Medicine

## 2021-11-13 ENCOUNTER — Other Ambulatory Visit: Payer: Self-pay

## 2021-11-13 ENCOUNTER — Ambulatory Visit
Admission: RE | Admit: 2021-11-13 | Discharge: 2021-11-13 | Disposition: A | Discharge status: Home / Self Care | Payer: BC Managed Care – PPO | Source: Ambulatory Visit | Attending: Family Medicine | Admitting: Family Medicine

## 2021-11-13 DIAGNOSIS — R921 Mammographic calcification found on diagnostic imaging of breast: Secondary | ICD-10-CM

## 2021-11-13 DIAGNOSIS — R928 Other abnormal and inconclusive findings on diagnostic imaging of breast: Secondary | ICD-10-CM | POA: Insufficient documentation

## 2021-11-13 DIAGNOSIS — N6021 Fibroadenosis of right breast: Secondary | ICD-10-CM | POA: Diagnosis not present

## 2021-11-13 DIAGNOSIS — R920 Mammographic microcalcification found on diagnostic imaging of breast: Secondary | ICD-10-CM | POA: Diagnosis not present

## 2021-11-13 HISTORY — PX: BREAST BIOPSY: SHX20

## 2021-11-14 LAB — SURGICAL PATHOLOGY

## 2021-12-15 ENCOUNTER — Encounter: Payer: Self-pay | Admitting: Dermatology

## 2021-12-15 ENCOUNTER — Ambulatory Visit: Payer: BC Managed Care – PPO | Admitting: Dermatology

## 2021-12-15 DIAGNOSIS — L409 Psoriasis, unspecified: Secondary | ICD-10-CM | POA: Diagnosis not present

## 2021-12-15 MED ORDER — ZORYVE 0.3 % EX CREA: TOPICAL_CREAM | CUTANEOUS | 3 refills | Status: DC

## 2021-12-15 NOTE — Patient Instructions (Signed)
Continue Vtama daily as directed once daily.  ? ?Start Zoryve cream once daily. ? ? ?If You Need Anything After Your Visit ? ?If you have any questions or concerns for your doctor, please call our main line at [phone] and press option 4 to reach your doctor's medical assistant. If no one answers, please leave a voicemail as directed and we will return your call as soon as possible. Messages left after 4 pm will be answered the following business day.  ? ?You may also send [place] a message via MyChart. We typically respond to MyChart messages within 1-2 business days. ? ?For prescription refills, please ask your pharmacy to contact our office. Our fax number is [phone]. ? ?If you have an urgent issue when the clinic is closed that cannot wait until the next business day, you can page your doctor at the number below.   ? ?Please note that while we do our best to be available for urgent issues outside of office hours, we are not available 24/7.  ? ?If you have an urgent issue and are unable to reach [place], you may choose to seek medical care at your doctor's office, retail clinic, urgent care center, or emergency room. ? ?If you have a medical emergency, please immediately call 911 or go to the emergency department. ? ?Pager Numbers ? ?- Dr. Nehemiah Massed: [phone] ? ?- Dr. Laurence Ferrari: [phone] ? ?- Dr. Nicole Kindred: [phone] ? ?In the event of inclement weather, please call our main line at [phone] for an update on the status of any delays or closures. ? ?Dermatology Medication Tips: ?Please keep the boxes that topical medications come in in order to help keep track of the instructions about where and how to use these. Pharmacies typically print the medication instructions only on the boxes and not directly on the medication tubes.  ? ?If your medication is too expensive, please contact our office at [phone] option 4 or send [place] a message through [place].  ? ?We are unable to tell what your co-pay for  medications will be in advance as this is different depending on your insurance coverage. However, we may be able to find a substitute medication at lower cost or fill out paperwork to get insurance to cover a needed medication.  ? ?If a prior authorization is required to get your medication covered by your insurance company, please allow [place] 1-2 business days to complete this process. ? ?Drug prices often vary depending on where the prescription is filled and some pharmacies may offer cheaper prices. ? ?The website www.goodrx.com contains coupons for medications through different pharmacies. The prices here do not account for what the cost may be with help from insurance (it may be cheaper with your insurance), but the website can give you the price if you did not use any insurance.  ?- You can print the associated coupon and take it with your prescription to the pharmacy.  ?- You may also stop by our office during regular business hours and pick up a GoodRx coupon card.  ?- If you need your prescription sent electronically to a different pharmacy, notify our office through [facility] or by phone at [phone] option 4. ? ? ? ? ?Si Usted Necesita Algo Despu?s de Su Visita ? ?Tambi?n puede enviarnos un mensaje a trav?s de MyChart. Por lo general respondemos a los mensajes de MyChart en el transcurso de 1 a 2 d?as h?biles. ? ?Para renovar recetas, por favor pida a su farmacia que se  ponga en contacto con nuestra oficina. Nuestro n?mero de fax es el [phone]. ? ?Si tiene un asunto urgente cuando la cl?nica est? cerrada y que no puede esperar hasta el siguiente d?a h?bil, puede llamar/localizar a su doctor(a) al n?mero que aparece a continuaci?n.  ? ?Por favor, tenga en cuenta que aunque hacemos todo lo posible para estar disponibles para asuntos urgentes fuera del horario de oficina, no estamos disponibles las 24 horas del d?a, los 7 d?as de la semana.  ? ?Si tiene un problema urgente y no puede  comunicarse con nosotros, puede optar por buscar atenci?n m?dica  en el consultorio de su doctor(a), en una cl?nica privada, en un centro de atenci?n urgente o en una sala de emergencias. ? ?Si tiene Engineer, maintenance (IT) m?dica, por favor llame inmediatamente al 911 o vaya a la sala de emergencias. ? ?N?meros de b?per ? ?- Dr. Nehemiah Massed: [phone] ? ?- Dra. Moye: [phone] ? ?- Dra. Nicole Kindred: [phone] ? ?En caso de inclemencias del tiempo, por favor llame a nuestra l?nea principal al [phone] para una actualizaci?n sobre el estado de cualquier retraso o cierre. ? ?Consejos para la medicaci?n en dermatolog?a: ?Por favor, guarde las cajas en las que vienen los medicamentos de uso t?pico para ayudarle a seguir las instrucciones sobre d?nde y c?mo usarlos. Las farmacias generalmente imprimen las instrucciones del medicamento s?lo en las cajas y no directamente en los tubos del [place].  ? ?Si su medicamento es muy caro, por favor, p?ngase en contacto con Zigmund Daniel llamando al [phone] y presione la opci?n 4 o env?enos un mensaje a trav?s de MyChart.  ? ?No podemos decirle cu?l ser? su copago por los medicamentos por adelantado ya que esto es diferente dependiendo de la cobertura de su seguro. Sin embargo, es posible que podamos encontrar un medicamento sustituto a Electrical engineer un formulario para que el seguro cubra el medicamento que se considera necesario.  ? ?Si se requiere Ardelia Mems autorizaci?n previa para que su compa??a de seguros [place] su medicamento, por favor perm?tanos de 1 a 2 d?as h?biles para completar este proceso. ? ?Los precios de los medicamentos var?an con frecuencia dependiendo del Environmental consultant de d?nde se surte la receta y alguna farmacias pueden ofrecer precios m?s baratos. ? ?El sitio web www.goodrx.com tiene cupones para medicamentos de Airline pilot. Los precios aqu? no tienen en cuenta lo que podr?a costar con la ayuda del seguro (puede ser m?s barato con su seguro), pero  el sitio web puede darle el precio si no utiliz? ning?n seguro.  ?- Puede imprimir el cup?n correspondiente y llevarlo con su receta a la farmacia.  ?- Tambi?n puede pasar por nuestra oficina durante el horario de atenci?n regular y recoger una tarjeta de cupones de GoodRx.  ?- Si necesita que su receta se env?e electr?nicamente a Chiropodist, informe a nuestra oficina a trav?s de MyChart de [place] o por tel?fono llamando al [phone] y presione la opci?n 4.  ?

## 2021-12-15 NOTE — Progress Notes (Signed)
? ?  Follow-Up Visit ?  ?Subjective  ?Jaime Ramirez is a 71 y.o. female who presents for the following: Psoriasis (3 month recheck. Hands, elbows, feet. Started Vtama at last visit. Wynzora did not help. Patient states condition has improved with Vtama. She states the tubes are $35 and are half empty, and this is frustrating ). ?The patient has spots, moles and lesions to be evaluated, some may be new or changing and the patient has concerns that these could be cancer. ? ?The following portions of the chart were reviewed this encounter and updated as appropriate:  Tobacco  Allergies  Meds  Problems  Med Hx  Surg Hx  Fam Hx   ?  ?Review of Systems: No other skin or systemic complaints except as noted in HPI or Assessment and Plan. ? ?Objective  ?Well appearing patient in no apparent distress; mood and affect are within normal limits. ? ?A focused examination was performed including face, elbows, hands, feet. Relevant physical exam findings are noted in the Assessment and Plan. ? ?hands, feet, left elbow ?Mild xerosis and scale ? ? ? ? ? ? ? ? ? ? ? ? ? ? ? ? ? 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 Condition is symptomatic / bothersome to patient. Not to goal. ?Psoriasis is a chronic non-curable, but treatable genetic/hereditary disease that may have other systemic features affecting other organ systems such as joints (Psoriatic Arthritis). It is associated with an increased risk of inflammatory bowel disease, heart disease, non-alcoholic fatty liver disease, and depression.   ? ?Continue Vtama daily as directed once daily.  ?Start Zoryve cream once daily. ? ?Consider Urea 40% if Zoryve not covered.  ? ?Roflumilast (ZORYVE) 0.3 % CREA - hands, feet, left elbow ?Apply once daily to affected areas ?Related Medications ?Tapinarof (VTAMA) 1 % CREA ?Apply 1 application topically  daily. ? ?Return in about 6 months (around 06/16/2022) for Psoriasis Follow Up. ? ?I, Emelia Salisbury, CMA, am acting as scribe for [name], MD. ?Documentation: I have reviewed the above documentation for accuracy and completeness, and I agree with the above. ? ?[name], MD ? ? ?

## 2021-12-16 ENCOUNTER — Encounter: Payer: Self-pay | Admitting: Dermatology

## 2021-12-23 ENCOUNTER — Encounter: Payer: Self-pay | Admitting: Dermatology

## 2022-01-24 ENCOUNTER — Other Ambulatory Visit: Payer: Self-pay | Admitting: Family Medicine

## 2022-01-29 DIAGNOSIS — R202 Paresthesia of skin: Secondary | ICD-10-CM | POA: Diagnosis not present

## 2022-01-29 DIAGNOSIS — Z79899 Other long term (current) drug therapy: Secondary | ICD-10-CM | POA: Diagnosis not present

## 2022-01-29 DIAGNOSIS — G250 Essential tremor: Secondary | ICD-10-CM | POA: Diagnosis not present

## 2022-01-29 DIAGNOSIS — I10 Essential (primary) hypertension: Secondary | ICD-10-CM | POA: Diagnosis not present

## 2022-02-06 NOTE — Progress Notes (Unsigned)
Complete physical exam   Patient: Jaime Ramirez   DOB: 08-28-1950   71 y.o. Female  MRN: [id number] Visit Date: 02/09/2022  Today's healthcare provider: [name], MD   No chief complaint on file.  Subjective    Jaime Ramirez is a 71 y.o. female who presents today for a complete physical exam.  She reports consuming a {diet types:17450} diet. {Exercise:19826} She generally feels {well/fairly well/poorly:18703}. She reports sleeping {well/fairly well/poorly:18703}. She {does/does not:200015} have additional problems to discuss today.  HPI  ***  Past Medical History:  Diagnosis Date   Arthritis    right foot, left ankle   Cancer (HCC)    kidney   Essential tremor    GERD (gastroesophageal reflux disease)    Vertigo    random episodes. none in about 1 month   Past Surgical History:  Procedure Laterality Date   ANKLE SURGERY     APPENDECTOMY     BREAST BIOPSY Right 06/04/2015   demonstrating fibroadenomatous change with stromal hyalinization and calcifications   BREAST BIOPSY Right 11/13/2021   affirm bx, ribbon marker, path pending   CATARACT EXTRACTION W/PHACO Left 05/12/2017   Procedure: CATARACT EXTRACTION PHACO AND INTRAOCULAR LENS PLACEMENT (IOC)LEFT;  Surgeon: Leandrew Koyanagi, MD;  Location: [facility];  Service: Ophthalmology;  Laterality: Left;  IVA TOPICAL LEFT   CATARACT EXTRACTION W/PHACO Right 06/09/2017   Procedure: CATARACT EXTRACTION PHACO AND INTRAOCULAR LENS PLACEMENT ([place])  RIGHT;  Surgeon: Leandrew Koyanagi, MD;  Location: [facility];  Service: Ophthalmology;  Laterality: Right;   COLONOSCOPY WITH PROPOFOL N/A 02/21/2016   Procedure: COLONOSCOPY WITH PROPOFOL;  Surgeon: Lucilla Lame, MD;  Location: [facility];  Service: Endoscopy;  Laterality: N/A;   HERNIA REPAIR     KIDNEY SURGERY Left    kidney removal   TONSILLECTOMY AND ADENOIDECTOMY     TOTAL KNEE ARTHROPLASTY Bilateral    TUBAL LIGATION      Social History   Socioeconomic History   Marital status: Divorced    Spouse name: Not on file   Number of children: Not on file   Years of education: Not on file   Highest education level: Not on file  Occupational History   Not on file  Tobacco Use   Smoking status: Never   Smokeless tobacco: Never  Vaping Use   Vaping Use: Never used  Substance and Sexual Activity   Alcohol use: Yes    Alcohol/week: 0.0 standard drinks of alcohol    Comment: 2 drinks a month   Drug use: No   Sexual activity: Not on file  Other Topics Concern   Not on file  Social History Narrative   Not on file   Social Determinants of Health   Financial Resource Strain: Not on file  Food Insecurity: Not on file  Transportation Needs: Not on file  Physical Activity: Not on file  Stress: Not on file  Social Connections: Not on file  Intimate Partner Violence: Not on file   Family Status  Relation Name Status   Mother  Deceased   Father  Deceased   Sister  Alive   Brother  Alive   Daughter  [place]  (Not Specified)   [name]  (Not Specified)   MGM  (Not Specified)   MGF  (Not Specified)   Family History  Problem Relation Age of Onset   Atrial fibrillation Mother    Heart disease Mother  CHF   Hypertension Father    Kidney disease Father    Lupus Sister    Stroke Sister    Heart disease Sister    Breast cancer Maternal Aunt    Breast cancer Paternal Aunt    Brain cancer Paternal Aunt    Stroke Maternal Grandmother    Cancer Maternal Grandfather    Allergies  Allergen Reactions   Codeine Hives and Nausea Only   Hydromorphone Hives    Patient Care Team: Jerrol Banana., MD as PCP - General (Family Medicine)   Medications: Outpatient Medications Prior to Visit  Medication Sig   aspirin 81 MG tablet Take by mouth.   chlorthalidone (HYGROTON) 25 MG tablet Take 1 tablet (25 mg total) by mouth daily. (Patient not taking: Reported on 02/05/2021)    Cholecalciferol (VITAMIN D3 PO) Take by mouth.   Cyanocobalamin (VITAMIN B-12 PO) Take by mouth.   hydrochlorothiazide (HYDRODIURIL) 25 MG tablet TAKE 1 TABLET(25 MG) BY MOUTH DAILY   losartan (COZAAR) 50 MG tablet Take 1 tablet (50 mg total) by mouth daily. (Patient not taking: No sig reported)   mometasone (ELOCON) 0.1 % ointment Apply topically daily. (Patient not taking: Reported on 02/05/2021)   naproxen sodium (ANAPROX) 220 MG tablet Take 440 mg by mouth daily.   pantoprazole (PROTONIX) 40 MG tablet TAKE 1 TABLET BY MOUTH TWICE DAILY   propranolol ER (INDERAL LA) 60 MG 24 hr capsule Take 60 mg by mouth daily. (Patient not taking: No sig reported)   ranitidine (ZANTAC) 300 MG tablet TAKE 1 TABLET(300 MG) BY MOUTH DAILY (Patient not taking: No sig reported)   Roflumilast (ZORYVE) 0.3 % CREA Apply once daily to affected areas   Tapinarof (VTAMA) 1 % CREA Apply 1 application topically daily.   No facility-administered medications prior to visit.    Review of Systems  All other systems reviewed and are negative.   {Labs  Heme  Chem  Endocrine  Serology  Results Review (optional):23779}  Objective    There were no vitals taken for this visit. {Show previous vital signs (optional):23777}   Physical Exam  ***  Last depression screening scores    02/05/2021    9:17 AM 05/22/2020    8:45 AM 01/25/2020   10:36 AM  PHQ 2/9 Scores  PHQ - 2 Score 0 0 0  PHQ- 9 Score '1 4 1   '$ Last fall risk screening    02/05/2021    9:17 AM  [place] in the past year? 0  Number falls in past yr: 0  Injury with Fall? 0  Risk for fall due to : Impaired balance/gait  Follow up Falls evaluation completed   Last Audit-C alcohol use screening    02/05/2021    9:16 AM  Alcohol Use Disorder Test (AUDIT)  1. How often do you have a drink containing alcohol? 0  2. How many drinks containing alcohol do you have on a typical day when you are drinking? 0  3. How often do you have six or more  drinks on one occasion? 0  AUDIT-C Score 0   A score of 3 or more in women, and 4 or more in men indicates increased risk for alcohol abuse, EXCEPT if all of the points are from question 1   No results found for any visits on 02/09/22.  Assessment & Plan    Routine Health Maintenance and Physical Exam  Exercise Activities and Dietary recommendations  Goals  None     Immunization History  Administered Date(s) Administered   PFIZER(Purple Top)SARS-COV-2 Vaccination 08/28/2019, 09/18/2019   Pneumococcal Conjugate-13 12/31/2015   Pneumococcal Polysaccharide-23 01/05/2017   Tdap 07/18/2012, 03/20/2014   Zoster, Live 11/28/2014    Health Maintenance  Topic Date Due   Zoster Vaccines- Shingrix (1 of 2) Never done   DEXA SCAN  Never done   COVID-19 Vaccine (3 - Pfizer risk series) 10/16/2019   INFLUENZA VACCINE  03/24/2022   MAMMOGRAM  10/15/2023   TETANUS/TDAP  03/20/2024   COLONOSCOPY (Pts 45-59yr Insurance coverage will need to be confirmed)  02/20/2026   Pneumonia Vaccine 71 Years old  Completed   Hepatitis C Screening  Completed   HPV VACCINES  Aged Out    Discussed health benefits of physical activity, and encouraged her to engage in regular exercise appropriate for her age and condition.  ***  No follow-ups on file.     {provider attestation***:1}   RWilhemena Durie MD  [phone](phone) [phone](fax)  [place]

## 2022-02-09 ENCOUNTER — Ambulatory Visit (INDEPENDENT_AMBULATORY_CARE_PROVIDER_SITE_OTHER): Payer: BC Managed Care – PPO | Admitting: Family Medicine

## 2022-02-09 ENCOUNTER — Encounter: Payer: Self-pay | Admitting: Family Medicine

## 2022-02-09 VITALS — BP 136/78 | HR 94 | Wt 232.0 lb

## 2022-02-09 DIAGNOSIS — Z6833 Body mass index (BMI) 33.0-33.9, adult: Secondary | ICD-10-CM | POA: Diagnosis not present

## 2022-02-09 DIAGNOSIS — E6609 Other obesity due to excess calories: Secondary | ICD-10-CM

## 2022-02-09 DIAGNOSIS — L409 Psoriasis, unspecified: Secondary | ICD-10-CM

## 2022-02-09 DIAGNOSIS — R739 Hyperglycemia, unspecified: Secondary | ICD-10-CM | POA: Diagnosis not present

## 2022-02-09 DIAGNOSIS — Z0000 Encounter for general adult medical examination without abnormal findings: Secondary | ICD-10-CM

## 2022-02-09 DIAGNOSIS — I10 Essential (primary) hypertension: Secondary | ICD-10-CM | POA: Diagnosis not present

## 2022-02-09 DIAGNOSIS — E781 Pure hyperglyceridemia: Secondary | ICD-10-CM

## 2022-02-09 DIAGNOSIS — K219 Gastro-esophageal reflux disease without esophagitis: Secondary | ICD-10-CM

## 2022-02-09 DIAGNOSIS — G250 Essential tremor: Secondary | ICD-10-CM

## 2022-02-09 DIAGNOSIS — Z85528 Personal history of other malignant neoplasm of kidney: Secondary | ICD-10-CM

## 2022-02-10 LAB — LIPID PANEL
Chol/HDL Ratio: 3.2 ratio (ref 0.0–4.4)
Cholesterol, Total: 173 mg/dL (ref 100–199)
HDL: 54 mg/dL (ref >39)
LDL Chol Calc (NIH): 98 mg/dL (ref 0–99)
Triglycerides: 115 mg/dL (ref 0–149)
VLDL Cholesterol Cal: 21 mg/dL (ref 5–40)

## 2022-02-10 LAB — COMPREHENSIVE METABOLIC PANEL
ALT: 13 IU/L (ref 0–32)
AST: 20 IU/L (ref 0–40)
Albumin/Globulin Ratio: 1.8 (ref 1.2–2.2)
Albumin: 4.3 g/dL (ref 3.7–4.7)
Alkaline Phosphatase: 91 IU/L (ref 44–121)
BUN/Creatinine Ratio: 15 (ref 12–28)
BUN: 15 mg/dL (ref 8–27)
Bilirubin Total: 0.3 mg/dL (ref 0.0–1.2)
CO2: 25 mmol/L (ref 20–29)
Calcium: 9.7 mg/dL (ref 8.7–10.3)
Chloride: 101 mmol/L (ref 96–106)
Creatinine, Ser: 1.02 mg/dL — ABNORMAL HIGH (ref 0.57–1.00)
Globulin, Total: 2.4 g/dL (ref 1.5–4.5)
Glucose: 107 mg/dL — ABNORMAL HIGH (ref 70–99)
Potassium: 3.7 mmol/L (ref 3.5–5.2)
Sodium: 141 mmol/L (ref 134–144)
Total Protein: 6.7 g/dL (ref 6.0–8.5)
eGFR: 59 mL/min/{1.73_m2} — ABNORMAL LOW (ref >59)

## 2022-02-10 LAB — CBC WITH DIFFERENTIAL/PLATELET
Basophils Absolute: 0.1 10*3/uL (ref 0.0–0.2)
Basos: 1 %
EOS (ABSOLUTE): 0.3 10*3/uL (ref 0.0–0.4)
Eos: 6 %
Hematocrit: 39.6 % (ref 34.0–46.6)
Hemoglobin: 13.2 g/dL (ref 11.1–15.9)
Immature Grans (Abs): 0 10*3/uL (ref 0.0–0.1)
Immature Granulocytes: 1 %
Lymphocytes Absolute: 1.5 10*3/uL (ref 0.7–3.1)
Lymphs: 25 %
MCH: 28.4 pg (ref 26.6–33.0)
MCHC: 33.3 g/dL (ref 31.5–35.7)
MCV: 85 fL (ref 79–97)
Monocytes Absolute: 0.6 10*3/uL (ref 0.1–0.9)
Monocytes: 10 %
Neutrophils Absolute: 3.4 10*3/uL (ref 1.4–7.0)
Neutrophils: 57 %
Platelets: 289 10*3/uL (ref 150–450)
RBC: 4.64 x10E6/uL (ref 3.77–5.28)
RDW: 14 % (ref 11.7–15.4)
WBC: 5.9 10*3/uL (ref 3.4–10.8)

## 2022-02-10 LAB — HEMOGLOBIN A1C
Est. average glucose Bld gHb Est-mCnc: 117 mg/dL
Hgb A1c MFr Bld: 5.7 % — ABNORMAL HIGH (ref 4.8–5.6)

## 2022-02-10 LAB — TSH: TSH: 0.965 u[IU]/mL (ref 0.450–4.500)

## 2022-03-20 DIAGNOSIS — Z79899 Other long term (current) drug therapy: Secondary | ICD-10-CM | POA: Diagnosis not present

## 2022-04-03 DIAGNOSIS — R202 Paresthesia of skin: Secondary | ICD-10-CM | POA: Diagnosis not present

## 2022-04-03 DIAGNOSIS — G250 Essential tremor: Secondary | ICD-10-CM | POA: Diagnosis not present

## 2022-04-03 DIAGNOSIS — Z79899 Other long term (current) drug therapy: Secondary | ICD-10-CM | POA: Diagnosis not present

## 2022-04-03 DIAGNOSIS — R42 Dizziness and giddiness: Secondary | ICD-10-CM | POA: Diagnosis not present

## 2022-04-09 ENCOUNTER — Other Ambulatory Visit (HOSPITAL_COMMUNITY): Payer: Self-pay | Admitting: Student

## 2022-04-09 ENCOUNTER — Other Ambulatory Visit: Payer: Self-pay | Admitting: Student

## 2022-04-09 DIAGNOSIS — R42 Dizziness and giddiness: Secondary | ICD-10-CM

## 2022-06-15 ENCOUNTER — Ambulatory Visit: Payer: BC Managed Care – PPO | Admitting: Family Medicine

## 2022-06-17 ENCOUNTER — Ambulatory Visit: Payer: BC Managed Care – PPO | Admitting: Dermatology

## 2022-06-17 DIAGNOSIS — Z79899 Other long term (current) drug therapy: Secondary | ICD-10-CM | POA: Diagnosis not present

## 2022-06-17 DIAGNOSIS — L820 Inflamed seborrheic keratosis: Secondary | ICD-10-CM | POA: Diagnosis not present

## 2022-06-17 DIAGNOSIS — L409 Psoriasis, unspecified: Secondary | ICD-10-CM

## 2022-06-17 MED ORDER — ZORYVE 0.3 % EX CREA: TOPICAL_CREAM | CUTANEOUS | 6 refills | Status: DC

## 2022-06-17 NOTE — Patient Instructions (Addendum)
Cryotherapy Aftercare  Wash gently with soap and water everyday.   Apply Vaseline and Band-Aid daily until healed.    Your prescription was sent to [facility] : [facility] in [place]. A representative from [place] will contact you within 3 business hours to verify your address and insurance information to schedule a free delivery. If for any reason you do not receive a phone call from them, please reach out to them. Their phone number is [phone] and their hours are Monday-Friday 9:00 am-5:00 pm.    Due to recent changes in healthcare laws, you may see results of your pathology and/or laboratory studies on MyChart before the doctors have had a chance to review them. We understand that in some cases there may be results that are confusing or concerning to you. Please understand that not all results are received at the same time and often the doctors may need to interpret multiple results in order to provide you with the best plan of care or course of treatment. Therefore, we ask that you please give [place] 2 business days to thoroughly review all your results before contacting the office for clarification. Should we see a critical lab result, you will be contacted sooner.   If You Need Anything After Your Visit  If you have any questions or concerns for your doctor, please call our main line at [phone] and press option 4 to reach your doctor's medical assistant. If no one answers, please leave a voicemail as directed and we will return your call as soon as possible. Messages left after 4 pm will be answered the following business day.   You may also send [place] a message via [place]. We typically respond to MyChart messages within 1-2 business days.  For prescription refills, please ask your pharmacy to contact our office. Our fax number is [phone].  If you have an urgent issue when the clinic is closed that cannot wait until the next business day, you can page your doctor at the number  below.    Please note that while we do our best to be available for urgent issues outside of office hours, we are not available 24/7.   If you have an urgent issue and are unable to reach [place], you may choose to seek medical care at your doctor's office, retail clinic, urgent care center, or emergency room.  If you have a medical emergency, please immediately call 911 or go to the emergency department.  Pager Numbers  - Dr. Nehemiah Massed: [phone]  - Dr. Laurence Ferrari: [phone]  - Dr. Nicole Kindred: [phone]  In the event of inclement weather, please call our main line at [phone] for an update on the status of any delays or closures.  Dermatology Medication Tips: Please keep the boxes that topical medications come in in order to help keep track of the instructions about where and how to use these. Pharmacies typically print the medication instructions only on the boxes and not directly on the medication tubes.   If your medication is too expensive, please contact our office at [phone] option 4 or send [place] a message through [place].   We are unable to tell what your co-pay for medications will be in advance as this is different depending on your insurance coverage. However, we may be able to find a substitute medication at lower cost or fill out paperwork to get insurance to cover a needed medication.   If a prior authorization is required to get your medication covered by your insurance company, please  allow [place] 1-2 business days to complete this process.  Drug prices often vary depending on where the prescription is filled and some pharmacies may offer cheaper prices.  The website www.goodrx.com contains coupons for medications through different pharmacies. The prices here do not account for what the cost may be with help from insurance (it may be cheaper with your insurance), but the website can give you the price if you did not use any insurance.  - You can print the associated coupon  and take it with your prescription to the pharmacy.  - You may also stop by our office during regular business hours and pick up a GoodRx coupon card.  - If you need your prescription sent electronically to a different pharmacy, notify our office through [facility] or by phone at [phone] option 4.     Si Usted Necesita Algo Despus de Su Visita  Tambin puede enviarnos un mensaje a travs de Pharmacist, community. Por lo general respondemos a los mensajes de MyChart en el transcurso de 1 a 2 das hbiles.  Para renovar recetas, por favor pida a su farmacia que se ponga en contacto con nuestra oficina. Harland Dingwall de fax es [place] [phone].  Si tiene un asunto urgente cuando la clnica est cerrada y que no puede esperar hasta el siguiente da hbil, puede llamar/localizar a su doctor(a) al nmero que aparece a continuacin.   Por favor, tenga en cuenta que aunque hacemos todo lo posible para estar disponibles para asuntos urgentes fuera del horario de [place], no estamos disponibles las 24 horas del da, los 7 das de la [place].   Si tiene un problema urgente y no puede comunicarse con nosotros, puede optar por buscar atencin mdica  en el consultorio de su doctor(a), en una clnica privada, en un centro de atencin urgente o en una sala de emergencias.  Si tiene Engineering geologist, por favor llame inmediatamente al 911 o vaya a la sala de emergencias.  Nmeros de bper  - Dr. Nehemiah Massed: [phone]  - Dra. Moye: [phone]  - Dra. Nicole Kindred: [phone]  En caso de inclemencias del [place], por favor llame a Johnsie Kindred principal al [phone] para una actualizacin sobre el [place] de cualquier retraso o cierre.  Consejos para la medicacin en dermatologa: Por favor, guarde las cajas en las que vienen los medicamentos de uso tpico para ayudarle a seguir las instrucciones sobre dnde y cmo usarlos. Las farmacias generalmente imprimen las instrucciones del medicamento  slo en las cajas y no directamente en los tubos del [place].   Si su medicamento es muy caro, por favor, pngase en contacto con Zigmund Daniel llamando al [phone] y presione la opcin 4 o envenos un mensaje a travs de Pharmacist, community.   No podemos decirle cul ser su copago por los medicamentos por adelantado ya que esto es diferente dependiendo de la cobertura de su seguro. Sin embargo, es posible que podamos encontrar un medicamento sustituto a Electrical engineer un formulario para que el seguro cubra el medicamento que se considera necesario.   Si se requiere una autorizacin previa para que su compaa de seguros [place] su medicamento, por favor permtanos de 1 a 2 das hbiles para completar este proceso.  Los precios de los medicamentos varan con frecuencia dependiendo del Environmental consultant de dnde se surte la receta y alguna farmacias pueden ofrecer precios ms baratos.  El sitio web www.goodrx.com tiene cupones para medicamentos de Airline pilot. Los precios aqu no tienen en cuenta lo que  lo que podra costar con la ayuda del seguro (puede ser ms barato con su seguro), pero el sitio web puede darle el precio si no utiliz ningn seguro.  - Puede imprimir el cupn correspondiente y llevarlo con su receta a la farmacia.  - Tambin puede pasar por nuestra oficina durante el horario de atencin regular y recoger una tarjeta de cupones de GoodRx.  - Si necesita que su receta se enve electrnicamente a una farmacia diferente, informe a nuestra oficina a travs de MyChart de [place] o por telfono llamando al [phone] y presione la opcin 4.  

## 2022-06-17 NOTE — Progress Notes (Signed)
   Follow-Up Visit   Subjective  Jaime Ramirez is a 71 y.o. female who presents for the following: Psoriasis (Hands, feet, elbows, 79mf/u,  Zoryve qd, Urea cream 40%) and check spot (L neck, ~554mirritating, pt picks at).  The following portions of the chart were reviewed this encounter and updated as appropriate:   Tobacco  Allergies  Meds  Problems  Med Hx  Surg Hx  Fam Hx     Review of Systems:  No other skin or systemic complaints except as noted in HPI or Assessment and Plan.  Objective  Well appearing patient in no apparent distress; mood and affect are within normal limits.  A focused examination was performed including hands, feet, elbows. Relevant physical exam findings are noted in the Assessment and Plan.  hands, feet, elbows Minimal involvement on L elbow, hyperkeratosis and fissures bil feet  L neck x 1 Stuck on waxy paps with erythema   Assessment & Plan  Psoriasis hands, feet, elbows Psoriasis is a chronic non-curable, but treatable genetic/hereditary disease that may have other systemic features affecting other organ systems such as joints (Psoriatic Arthritis). It is associated with an increased risk of inflammatory bowel disease, heart disease, non-alcoholic fatty liver disease, and depression.    Chronic and persistent condition with duration or expected duration over one year. Condition is symptomatic / bothersome to patient. Not to goal.  Improved Cont Zoryve cr qd to aa psoriasis until clear, then prn flares Cont Urea Cream 40% to thicker area of psoriasis L elbow, feet until clear then prn flares  Related Medications Roflumilast (ZORYVE) 0.3 % CREA Qd to aa psoriasis on hands, left elbow and feet prn flares  Inflamed seborrheic keratosis L neck x 1 Symptomatic, irritating, patient would like treated. Destruction of lesion - L neck x 1 Complexity: simple   Destruction method: cryotherapy   Informed consent: discussed and consent obtained    Timeout:  patient name, date of birth, surgical site, and procedure verified Lesion destroyed using liquid nitrogen: Yes   Region frozen until ice ball extended beyond lesion: Yes   Outcome: patient tolerated procedure well with no complications   Post-procedure details: wound care instructions given    Return in about 6 months (around 12/17/2022) for Psoriasis f/u.  I, SoOthelia PullingRMA, am acting as scribe for DaSarina SerMD . Documentation: I have reviewed the above documentation for accuracy and completeness, and I agree with the above.  DaSarina SerMD

## 2022-06-22 ENCOUNTER — Encounter: Payer: Self-pay | Admitting: Dermatology

## 2022-07-27 ENCOUNTER — Encounter: Payer: Self-pay | Admitting: *Deleted

## 2022-07-27 ENCOUNTER — Other Ambulatory Visit: Payer: Self-pay

## 2022-07-27 ENCOUNTER — Emergency Department: Payer: BC Managed Care – PPO

## 2022-07-27 DIAGNOSIS — R0789 Other chest pain: Secondary | ICD-10-CM | POA: Diagnosis present

## 2022-07-27 DIAGNOSIS — Z96653 Presence of artificial knee joint, bilateral: Secondary | ICD-10-CM | POA: Insufficient documentation

## 2022-07-27 DIAGNOSIS — Z85528 Personal history of other malignant neoplasm of kidney: Secondary | ICD-10-CM | POA: Insufficient documentation

## 2022-07-27 DIAGNOSIS — Z7982 Long term (current) use of aspirin: Secondary | ICD-10-CM | POA: Diagnosis not present

## 2022-07-27 DIAGNOSIS — N179 Acute kidney failure, unspecified: Secondary | ICD-10-CM | POA: Diagnosis not present

## 2022-07-27 DIAGNOSIS — E876 Hypokalemia: Secondary | ICD-10-CM | POA: Insufficient documentation

## 2022-07-27 DIAGNOSIS — R002 Palpitations: Secondary | ICD-10-CM | POA: Diagnosis not present

## 2022-07-27 LAB — CBC WITH DIFFERENTIAL/PLATELET
Abs Immature Granulocytes: 0.04 10*3/uL (ref 0.00–0.07)
Basophils Absolute: 0.1 10*3/uL (ref 0.0–0.1)
Basophils Relative: 1 %
Eosinophils Absolute: 0.5 10*3/uL (ref 0.0–0.5)
Eosinophils Relative: 5 %
HCT: 43.4 % (ref 36.0–46.0)
Hemoglobin: 14 g/dL (ref 12.0–15.0)
Immature Granulocytes: 0 %
Lymphocytes Relative: 28 %
Lymphs Abs: 2.8 10*3/uL (ref 0.7–4.0)
MCH: 27.9 pg (ref 26.0–34.0)
MCHC: 32.3 g/dL (ref 30.0–36.0)
MCV: 86.6 fL (ref 80.0–100.0)
Monocytes Absolute: 0.7 10*3/uL (ref 0.1–1.0)
Monocytes Relative: 7 %
Neutro Abs: 5.9 10*3/uL (ref 1.7–7.7)
Neutrophils Relative %: 59 %
Platelets: 309 10*3/uL (ref 150–400)
RBC: 5.01 MIL/uL (ref 3.87–5.11)
RDW: 13.9 % (ref 11.5–15.5)
WBC: 9.9 10*3/uL (ref 4.0–10.5)
nRBC: 0 % (ref 0.0–0.2)

## 2022-07-27 LAB — TSH: TSH: 2.623 u[IU]/mL (ref 0.350–4.500)

## 2022-07-27 LAB — BASIC METABOLIC PANEL
Anion gap: 11 (ref 5–15)
BUN: 19 mg/dL (ref 8–23)
CO2: 22 mmol/L (ref 22–32)
Calcium: 9.3 mg/dL (ref 8.9–10.3)
Chloride: 105 mmol/L (ref 98–111)
Creatinine, Ser: 1.18 mg/dL — ABNORMAL HIGH (ref 0.44–1.00)
GFR, Estimated: 49 mL/min — ABNORMAL LOW (ref >60)
Glucose, Bld: 142 mg/dL — ABNORMAL HIGH (ref 70–99)
Potassium: 3.1 mmol/L — ABNORMAL LOW (ref 3.5–5.1)
Sodium: 138 mmol/L (ref 135–145)

## 2022-07-27 LAB — TROPONIN I (HIGH SENSITIVITY)
Troponin I (High Sensitivity): 4 ng/L (ref <18)
Troponin I (High Sensitivity): 4 ng/L (ref <18)

## 2022-07-27 NOTE — ED Provider Triage Note (Signed)
Emergency Medicine Provider Triage Evaluation Note  Jaime Ramirez , a 71 y.o. female  was evaluated in triage.  Pt complains of intermittent palpitations. Had one episode in October, and then none until today. Reoccurred again 40 minutes ago while watching TV. No CP/SOB. No dizziness. Denies excessive ETOH/caffeine   Review of Systems  Positive: palpitations Negative: CP, SOB  Physical Exam  There were no vitals taken for this visit. Gen:   Awake, no distress   Resp:  Normal effort MSK:   Moves extremities without difficulty  Other:    Medical Decision Making  Medically screening exam initiated at 8:07 PM.  Appropriate orders placed.  JAIYANA CANALE was informed that the remainder of the evaluation will be completed by another provider, this initial triage assessment does not replace that evaluation, and the importance of remaining in the ED until their evaluation is complete.     Marquette Old, PA-C 07/27/22 2010

## 2022-07-27 NOTE — ED Triage Notes (Signed)
Pt reports cheat pain and rapid heart rate tonight.  No sob.  No n/v/  sx began while watching tv tonight.  Pt alert   speech clear.

## 2022-07-28 ENCOUNTER — Emergency Department: Payer: BC Managed Care – PPO

## 2022-07-28 ENCOUNTER — Emergency Department
Admission: EM | Admit: 2022-07-28 | Discharge: 2022-07-28 | Disposition: A | Discharge status: Home / Self Care | Payer: BC Managed Care – PPO | Attending: Emergency Medicine | Admitting: Emergency Medicine

## 2022-07-28 DIAGNOSIS — R002 Palpitations: Secondary | ICD-10-CM

## 2022-07-28 DIAGNOSIS — N179 Acute kidney failure, unspecified: Secondary | ICD-10-CM

## 2022-07-28 DIAGNOSIS — E876 Hypokalemia: Secondary | ICD-10-CM

## 2022-07-28 LAB — D-DIMER, QUANTITATIVE: D-Dimer, Quant: 0.51 ug/mL-FEU — ABNORMAL HIGH (ref 0.00–0.50)

## 2022-07-28 LAB — MAGNESIUM: Magnesium: 1.9 mg/dL (ref 1.7–2.4)

## 2022-07-28 MED ORDER — SODIUM CHLORIDE 0.9 % IV BOLUS
1000.0000 mL | Freq: Once | INTRAVENOUS | Status: AC
  Administered 2022-07-28: 1000 mL via INTRAVENOUS

## 2022-07-28 MED ORDER — POTASSIUM CHLORIDE CRYS ER 20 MEQ PO TBCR
40.0000 meq | EXTENDED_RELEASE_TABLET | Freq: Once | ORAL | Status: AC
  Administered 2022-07-28: 40 meq via ORAL
  Filled 2022-07-28: qty 2

## 2022-07-28 MED ORDER — TECHNETIUM TO 99M ALBUMIN AGGREGATED
4.1600 | Freq: Once | INTRAVENOUS | Status: AC | PRN
  Administered 2022-07-28: 4.16 via INTRAVENOUS

## 2022-07-28 NOTE — Discharge Instructions (Signed)
Drink plenty of fluids daily.  Return to the ER for worsening symptoms, persistent vomiting, difficulty breathing or other concerns. °

## 2022-07-28 NOTE — ED Provider Notes (Signed)
[facility] Provider Note    Event Date/Time   First MD Initiated Contact with Patient 07/28/22 0211     (approximate)   History   Chest Pain   HPI  Jaime Ramirez is a 71 y.o. female who presents to the ED from home with a chief complaint of chest discomfort and palpitations tonight.  Patient was laying on her left side at rest and had a 30 to 45-minute episode of fast and pounding heart rate.  Denies associated diaphoresis, shortness of breath, nausea/vomiting or dizziness.  This is the second episode she has had in the past 5 weeks.  The prior episode occurred after she got off of a flight from [place].  Denies recent fever/chills, cough, abdominal pain, dysuria or diarrhea.  Denies calf pain or tenderness.  Drinks 1 cup of coffee in the morning daily.  No sodas, sweet tea or energy drinks.     Past Medical History   Past Medical History:  Diagnosis Date   Arthritis    right foot, left ankle   Cancer (HCC)    kidney   Essential tremor    GERD (gastroesophageal reflux disease)    Vertigo    random episodes. 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   Acid reflux 12/26/2015   BP (high blood pressure) 12/26/2015   Cannot sleep 12/26/2015   Hematoma 12/26/2015   Disorder of kidney and ureter 12/26/2015   Adiposity 12/26/2015   Adenocarcinoma, renal cell ([place]) 12/26/2015   Atheroma, skin 12/26/2015     Past Surgical History   Past Surgical History:  Procedure Laterality Date   ANKLE SURGERY     APPENDECTOMY     BREAST BIOPSY Right 06/04/2015   demonstrating fibroadenomatous change with stromal hyalinization and calcifications   BREAST BIOPSY Right 11/13/2021   affirm bx, ribbon marker, path pending   CATARACT  EXTRACTION W/PHACO Left 05/12/2017   Procedure: CATARACT EXTRACTION PHACO AND INTRAOCULAR LENS PLACEMENT (IOC)LEFT;  Surgeon: Leandrew Koyanagi, MD;  Location: [place];  Service: Ophthalmology;  Laterality: Left;  IVA TOPICAL LEFT   CATARACT EXTRACTION W/PHACO Right 06/09/2017   Procedure: CATARACT EXTRACTION PHACO AND INTRAOCULAR LENS PLACEMENT ([place])  RIGHT;  Surgeon: Leandrew Koyanagi, MD;  Location: [place];  Service: Ophthalmology;  Laterality: Right;   COLONOSCOPY WITH PROPOFOL N/A 02/21/2016   Procedure: COLONOSCOPY WITH PROPOFOL;  Surgeon: Lucilla Lame, MD;  Location: [place];  Service: Endoscopy;  Laterality: N/A;   HERNIA REPAIR     KIDNEY SURGERY Left    kidney removal   TONSILLECTOMY AND ADENOIDECTOMY     TOTAL KNEE ARTHROPLASTY Bilateral    TUBAL LIGATION       Home Medications   Prior to Admission medications   Medication Sig Start Date End Date Taking? Authorizing Provider  aspirin 81 MG tablet Take by mouth.    [provider]  chlorthalidone (HYGROTON) 25 MG tablet Take 1 tablet (25 mg total) by mouth daily. Patient not taking: Reported on 02/05/2021 05/22/20   Jerrol Banana., MD  Cholecalciferol (VITAMIN D3 PO) Take by mouth.    [provider]  Cyanocobalamin (VITAMIN B-12 PO) Take by mouth.    [provider]  gabapentin (NEURONTIN) 300 MG capsule Take 300 mg by mouth 2 (two) times daily.    [provider]  hydrochlorothiazide (HYDRODIURIL) 25 MG tablet TAKE 1 TABLET(25 MG) BY MOUTH DAILY 10/10/21   Jerrol Banana., MD  losartan (COZAAR) 50 MG tablet Take 1 tablet (50 mg total) by mouth daily. Patient not taking: No sig reported 06/16/17   Jerrol Banana., MD  mometasone (ELOCON) 0.1 % ointment Apply topically daily. Patient not taking: Reported on 02/05/2021 05/22/20   Jerrol Banana., MD  naproxen sodium (ANAPROX) 220 MG tablet Take 440 mg by mouth daily.     [provider]  pantoprazole (PROTONIX) 40 MG tablet TAKE 1 TABLET BY MOUTH TWICE DAILY 01/26/22   Jerrol Banana., MD  primidone (MYSOLINE) 50 MG tablet Take by mouth 4 (four) times daily. Titrating up    [provider]  propranolol ER (INDERAL LA) 60 MG 24 hr capsule Take 60 mg by mouth daily. Patient not taking: No sig reported 03/21/20   [provider]  ranitidine (ZANTAC) 300 MG tablet TAKE 1 TABLET(300 MG) BY MOUTH DAILY Patient not taking: No sig reported 06/08/18   Jerrol Banana., MD  Roflumilast (ZORYVE) 0.3 % CREA Qd to aa psoriasis on hands, left elbow and feet prn flares 06/17/22   Ralene Bathe, MD     Allergies  Codeine and Hydromorphone   Family History   Family History  Problem Relation Age of Onset   Atrial fibrillation Mother    Heart disease Mother        CHF   Hypertension Father    Kidney disease Father    Lupus Sister    Stroke Sister    Heart disease Sister    Breast cancer Maternal Aunt    Breast cancer Paternal Aunt    Brain cancer Paternal Aunt    Stroke Maternal Grandmother    Cancer Maternal Grandfather      Physical Exam  Triage Vital Signs: ED Triage Vitals [07/27/22 2016]  Enc Vitals Group     BP (!) 179/108     Pulse Rate (!) 110     Resp 20     Temp 98.6 F (37 C)     Temp Source Oral     SpO2 97 %     Weight 228 lb (103.4 kg)     Height '5\' 9"'$  (1.753 m)     Head Circumference      Peak Flow      Pain Score 0     Pain Loc      Pain Edu?      Excl. in [place]?     Updated Vital Signs: BP (!) 159/86 (BP Location: Right Arm)   Pulse 69   Temp 98 F (36.7 C) (Oral)   Resp 15   Ht '5\' 9"'$  (1.753 m)   Wt 103.4 kg   SpO2 99%   BMI 33.67 kg/m    General: Awake, no distress.  CV:  RRR.  Good peripheral perfusion.  Resp:  Normal effort.  CTA B. Abd:  Nontender.  No distention.  Other:  No thyromegaly.  Bilateral calves are nontender and not swollen.   ED Results / Procedures /  Treatments  Labs (all labs ordered are listed, but only abnormal results are displayed) Labs Reviewed  BASIC METABOLIC PANEL - Abnormal; Notable for the following components:      Result Value   Potassium 3.1 (*)  Glucose, Bld 142 (*)    Creatinine, Ser 1.18 (*)    GFR, Estimated 49 (*)    All other components within normal limits  D-DIMER, QUANTITATIVE - Abnormal; Notable for the following components:   D-Dimer, Quant 0.51 (*)    All other components within normal limits  CBC WITH DIFFERENTIAL/PLATELET  TSH  MAGNESIUM  TROPONIN I (HIGH SENSITIVITY)  TROPONIN I (HIGH SENSITIVITY)     EKG  ED ECG REPORT I, Manha Amato J, the attending physician, personally viewed and interpreted this ECG.   Date: 07/28/2022  EKG Time: 2011  Rate: 106  Rhythm: sinus tachycardia  Axis: Normal  Intervals:none  ST&T Change: Nonspecific    RADIOLOGY I have independently visualized and interpreted patient's chest x-ray as well as noted the radiology interpretation:  Chest x-ray: No acute cardiopulmonary process  DVT ultrasounds: Negative  VQ scan: Pending  Official radiology report(s): US Venous Img Lower Bilateral (DVT)  Result Date: 07/28/2022 CLINICAL DATA:  Elevated D-dimer EXAM: BILATERAL LOWER EXTREMITY VENOUS DOPPLER ULTRASOUND TECHNIQUE: Gray-scale sonography with compression, as well as color and duplex ultrasound, were performed to evaluate the deep venous system(s) from the level of the common femoral vein through the popliteal and proximal calf veins. COMPARISON:  None Available. FINDINGS: VENOUS Normal compressibility of the common femoral, superficial femoral, and popliteal veins, as well as the visualized calf veins. Visualized portions of profunda femoral vein and great saphenous vein unremarkable. No filling defects to suggest DVT on grayscale or color Doppler imaging. Doppler waveforms show normal direction of venous flow, normal respiratory plasticity and response to  augmentation. OTHER None. Limitations: none IMPRESSION: Negative. Electronically Signed   By: Rolm Baptise M.D.   On: 07/28/2022 03:48   DG Chest 2 View  Result Date: 07/27/2022 CLINICAL DATA:  Palpitations. EXAM: CHEST - 2 VIEW COMPARISON:  Chest x-ray 03/20/2014 FINDINGS: The heart size and mediastinal contours are within normal limits. Both lungs are clear. The visualized skeletal structures are unremarkable. IMPRESSION: No active cardiopulmonary disease. Electronically Signed   By: Ronney Asters M.D.   On: 07/27/2022 20:44     PROCEDURES:  Critical Care performed: No  .1-3 Lead EKG Interpretation  Performed by: Paulette Blanch, MD Authorized by: Paulette Blanch, MD     Interpretation: normal     ECG rate:  90   ECG rate assessment: normal     Rhythm: sinus rhythm     Ectopy: none     Conduction: normal   Comments:     Patient placed on cardiac monitor to evaluate for arrhythmias    MEDICATIONS ORDERED IN ED: Medications  sodium chloride 0.9 % bolus 1,000 mL (1,000 mLs Intravenous New Bag/Given 07/28/22 0434)  potassium chloride SA (KLOR-CON M) CR tablet 40 mEq (40 mEq Oral Given 07/28/22 0414)     IMPRESSION / MDM / ASSESSMENT AND PLAN / ED COURSE  I reviewed the triage vital signs and the nursing notes.                             71 year old female presenting with chest pain and palpitations. Differential diagnosis includes, but is not limited to, ACS, aortic dissection, pulmonary embolism, cardiac tamponade, pneumothorax, pneumonia, pericarditis, myocarditis, GI-related causes including esophagitis/gastritis, and musculoskeletal chest wall pain.   I have personally reviewed patient's records and note a dermatology office visit from 06/17/2022 for psoriasis.  Patient's presentation is most consistent with acute presentation with potential threat to  life or bodily function.  The patient is on the cardiac monitor to evaluate for evidence of arrhythmia and/or significant heart  rate changes.  Laboratory results demonstrate normal WBC 9.9, mild hypokalemia with potassium 3.1, mild AKI creatinine 1.18 compared to prior, 2 sets of negative troponins, normal thyroid and magnesium.  Awaiting results of D-dimer.  Clinical Course as of 07/28/22 0724  Tue Jul 28, 2022  0302 Patient's D-dimer is slightly elevated at 0.51.  She has a history of prior nephrectomy secondary to renal cell carcinoma.  We have discussed at length about pursuing PE study and agree to board her overnight for VQ scan in the morning.  In the meantime we will obtain bilateral DVT ultrasound.  Will initiate IV fluid hydration, replete potassium orally. [JS]  1607 DVT ultrasounds are negative. [JS]  [id number] Care transferred to Dr. Jari Pigg at change of shift pending V/Q scan. Patient will be appropriate for discharge if negative. Have placed internal cardiology referral for follow up. [JS]    Clinical Course User Index [JS] Paulette Blanch, MD     FINAL CLINICAL IMPRESSION(S) / ED DIAGNOSES   Final diagnoses:  Palpitations  Hypokalemia  AKI (acute kidney injury) ([place])     Rx / DC Orders   ED Discharge Orders          Ordered    Ambulatory referral to Cardiology       Comments: If you have not heard from the Cardiology office within the next 72 hours please call [phone].   07/28/22 0304             Note:  This document was prepared using Dragon voice recognition software and may include unintentional dictation errors.   Paulette Blanch, MD 07/28/22 [phone]

## 2022-07-28 NOTE — ED Provider Notes (Signed)
8:47 AM Assumed care for off going team.   Blood pressure (!) 159/86, pulse 69, temperature 98 F (36.7 C), temperature source Oral, resp. rate 15, height '5\' 9"'$  (1.753 m), weight 103.4 kg, SpO2 99 %.  See their HPI for full report but in brief   Trop x2 negative   IMPRESSION: Normal V/Q scan (pulmonary embolism absent).  Reevaluated patient she remain asymptomatic at this time.  She is requesting discharge home.  VQ scan is as above and she feels comfortable with discharge understands importance of following up with cardiology to discuss possible Holter monitor     Vanessa [place], MD 07/28/22 [phone]

## 2022-07-28 NOTE — ED Notes (Signed)
Patient taken to nuclear medicine at this time.

## 2022-09-14 ENCOUNTER — Encounter: Payer: Self-pay | Admitting: Cardiology

## 2022-09-14 ENCOUNTER — Ambulatory Visit: Payer: BC Managed Care – PPO | Attending: Cardiology | Admitting: Cardiology

## 2022-09-14 ENCOUNTER — Ambulatory Visit (INDEPENDENT_AMBULATORY_CARE_PROVIDER_SITE_OTHER): Payer: BC Managed Care – PPO

## 2022-09-14 VITALS — BP 144/88 | HR 76 | Ht 70.0 in | Wt 231.2 lb

## 2022-09-14 DIAGNOSIS — R002 Palpitations: Secondary | ICD-10-CM

## 2022-09-14 DIAGNOSIS — I10 Essential (primary) hypertension: Secondary | ICD-10-CM | POA: Diagnosis not present

## 2022-09-14 NOTE — Patient Instructions (Signed)
Medication Instructions:   Your physician recommends that you continue on your current medications as directed. Please refer to the Current Medication list given to you today.  *If you need a refill on your cardiac medications before your next appointment, please call your pharmacy*   Lab Work:  None Ordered  If you have labs (blood work) drawn today and your tests are completely normal, you will receive your results only by: [place] (if you have MyChart) OR A paper copy in the mail If you have any lab test that is abnormal or we need to change your treatment, we will call you to review the results.   Testing/Procedures:  Your physician has recommended that you wear a Zio monitor.   This monitor is a medical device that records the heart's electrical activity. Doctors most often use these monitors to diagnose arrhythmias. Arrhythmias are problems with the speed or rhythm of the heartbeat. The monitor is a small device applied to your chest. You can wear one while you do your normal daily activities. While wearing this monitor if you have any symptoms to push the button and record what you felt. Once you have worn this monitor for the period of time provider prescribed (Usually 14 days), you will return the monitor device in the postage paid box. Once it is returned they will download the data collected and provide [place] with a report which the provider will then review and we will call you with those results. Important tips:  Avoid showering during the first 24 hours of wearing the monitor. Avoid excessive sweating to help maximize wear time. Do not submerge the device, no hot tubs, and no swimming pools. Keep any lotions or oils away from the patch. After 24 hours you may shower with the patch on. Take brief showers with your back facing the shower head.  Do not remove patch once it has been placed because that will interrupt data and decrease adhesive wear time. Push the button  when you have any symptoms and write down what you were feeling. Once you have completed wearing your monitor, remove and place into box which has postage paid and place in your outgoing mailbox.  If for some reason you have misplaced your box then call our office and we can provide another box and/or mail it off for you.      Follow-Up: At [facility], you and your health needs are our priority.  As part of our continuing mission to provide you with exceptional heart care, we have created designated Provider Care Teams.  These Care Teams include your primary Cardiologist (physician) and Advanced Practice Providers (APPs -  Physician Assistants and Nurse Practitioners) who all work together to provide you with the care you need, when you need it.  We recommend signing up for the patient portal called "MyChart".  Sign up information is provided on this After Visit Summary.  MyChart is used to connect with patients for Virtual Visits (Telemedicine).  Patients are able to view lab/test results, encounter notes, upcoming appointments, etc.  Non-urgent messages can be sent to your provider as well.   To learn more about what you can do with MyChart, go to [email or web address].    Your next appointment:   8 week(s)  Provider:   You may see Kate Sable, MD or one of the following Advanced Practice Providers on your designated Care Team:   Murray Hodgkins, NP Christell Faith, PA-C Cadence Kathlen Mody, PA-C Gerrie Nordmann, NP

## 2022-09-14 NOTE — Progress Notes (Signed)
Cardiology Office Note:    Date:  09/14/2022   ID:  Jaime Ramirez, DOB 04-03-1951, MRN [id number]  PCP:  Jerrol Banana., MD   [place] Prov[facility] Cardiologist:  Kate Sable, MD     Referring MD: Paulette Blanch, MD   Chief Complaint  Patient presents with   New Patient (Initial Visit)    Palpitations two separate events, no other cardiac concerns    Jaime Ramirez is a 72 y.o. female who is being seen today for the evaluation of palpitations at the request of Paulette Blanch, MD.   History of Present Illness:    Jaime Ramirez is a 72 y.o. female with a hx of hypertension, essential tremor, renal cell carcinoma s/p left nephrectomy 2020 who presents due to palpitations.  Patient states having heart flutters/palpitations beginning 3 months ago.  She was at home laying on her left side with she felt symptoms of on and off low-dose lasting 5 minutes.  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  Symptoms return a month later, stating the situation with laying on her left side.  Palpitations" about 5 minutes.  She went to the ED at this time, workup was unrevealing.  Was advised to follow-up with cardiology.  Has not had any episodes since, denies any history of heart disease.  States having history of essential tremors, has tried several medications but could not tolerate.  Past Medical History:  Diagnosis Date   Arthritis    right foot, left ankle   Cancer (HCC)    kidney   Essential tremor    GERD (gastroesophageal reflux disease)    Vertigo    random episodes. none in about 1 month    Past Surgical History:  Procedure Laterality Date   ANKLE SURGERY     APPENDECTOMY     BREAST BIOPSY Right 06/04/2015   demonstrating fibroadenomatous change with stromal hyalinization and calcifications   BREAST BIOPSY Right 11/13/2021   affirm bx, ribbon marker, path pending   CATARACT EXTRACTION W/PHACO Left 05/12/2017   Procedure: CATARACT EXTRACTION  PHACO AND INTRAOCULAR LENS PLACEMENT (IOC)LEFT;  Surgeon: Leandrew Koyanagi, MD;  Location: [facility];  Service: Ophthalmology;  Laterality: Left;  IVA TOPICAL LEFT   CATARACT EXTRACTION W/PHACO Right 06/09/2017   Procedure: CATARACT EXTRACTION PHACO AND INTRAOCULAR LENS PLACEMENT ([place])  RIGHT;  Surgeon: Leandrew Koyanagi, MD;  Location: [facility];  Service: Ophthalmology;  Laterality: Right;   COLONOSCOPY WITH PROPOFOL N/A 02/21/2016   Procedure: COLONOSCOPY WITH PROPOFOL;  Surgeon: Lucilla Lame, MD;  Location: [facility];  Service: Endoscopy;  Laterality: N/A;   HERNIA REPAIR     KIDNEY SURGERY Left    kidney removal   TONSILLECTOMY AND ADENOIDECTOMY     TOTAL KNEE ARTHROPLASTY Bilateral    TUBAL LIGATION      Current Medications: Current Meds  Medication Sig   aspirin 81 MG tablet Take by mouth.   gabapentin (NEURONTIN) 300 MG capsule Take 300 mg by mouth 2 (two) times daily.   hydrochlorothiazide (HYDRODIURIL) 25 MG tablet TAKE 1 TABLET(25 MG) BY MOUTH DAILY   naproxen sodium (ANAPROX) 220 MG tablet Take 440 mg by mouth daily.   pantoprazole (PROTONIX) 40 MG tablet TAKE 1 TABLET BY MOUTH TWICE DAILY   Roflumilast (ZORYVE) 0.3 % CREA Qd to aa psoriasis on hands, left elbow and feet prn flares     Allergies:   Codeine and Hydromorphone   Social History  Socioeconomic History   Marital status: Divorced    Spouse name: Not on file   Number of children: Not on file   Years of education: Not on file   Highest education level: Not on file  Occupational History   Not on file  Tobacco Use   Smoking status: Never    Passive exposure: Past   Smokeless tobacco: Never  Vaping Use   Vaping Use: Never used  Substance and Sexual Activity   Alcohol use: Yes    Alcohol/week: 0.0 standard drinks of alcohol    Comment: 2 drinks a month   Drug use: No   Sexual activity: Not on file  Other Topics Concern   Not on file  Social History Narrative    Not on file   Social Determinants of Health   Financial Resource Strain: Not on file  Food Insecurity: Not on file  Transportation Needs: Not on file  Physical Activity: Not on file  Stress: Not on file  Social Connections: Not on file     Family History: The patient's family history includes Atrial fibrillation in her mother; Brain cancer in her paternal aunt; Breast cancer in her maternal aunt and paternal aunt; Cancer in her maternal grandfather; Heart disease in her mother and sister; Hypertension in her father; Kidney disease in her father; Lupus in her sister; Stroke in her maternal grandmother and sister.  ROS:   Please see the history of present illness.     All other systems reviewed and are negative.  EKGs/Labs/Other Studies Reviewed:    The following studies were reviewed today:   EKG:  EKG is  ordered today.  The ekg ordered today demonstrates normal sinus rhythm  Recent Labs: 02/09/2022: ALT 13 07/27/2022: BUN 19; Creatinine, Ser 1.18; Hemoglobin 14.0; Magnesium 1.9; Platelets 309; Potassium 3.1; Sodium 138; TSH 2.623  Recent Lipid Panel    Component Value Date/Time   CHOL 173 02/09/2022 1012   TRIG 115 02/09/2022 1012   HDL 54 02/09/2022 1012   CHOLHDL 3.2 02/09/2022 1012   LDLCALC 98 02/09/2022 1012     Risk Assessment/Calculations:         Physical Exam:    VS:  BP (!) 144/88 (BP Location: Left Arm)   Pulse 76   Ht '5\' 10"'$  (1.778 m)   Wt 231 lb 3.2 oz (104.9 kg)   SpO2 97%   BMI 33.17 kg/m     Wt Readings from Last 3 Encounters:  09/14/22 231 lb 3.2 oz (104.9 kg)  07/27/22 228 lb (103.4 kg)  02/09/22 232 lb (105.2 kg)     GEN:  Well nourished, well developed in no acute distress HEENT: Normal NECK: No JVD; No carotid bruits CARDIAC: RRR, no murmurs, rubs, gallops RESPIRATORY:  Clear to auscultation without rales, wheezing or rhonchi  ABDOMEN: Soft, non-tender, non-distended MUSCULOSKELETAL:  No edema; No deformity  SKIN: Warm and  dry NEUROLOGIC:  Alert and oriented x 3 PSYCHIATRIC:  Normal affect   ASSESSMENT:    1. Palpitations   2. Primary hypertension    PLAN:    In order of problems listed above:  Palpitations, place cardiac monitor to evaluate any significant arrhythmias.  Unsure if history of essential tremors were contributing. Hypertension, BP elevated today, usually controlled at home.  Continue HCTZ 25 mg daily.  Follow-up after cardiac monitor       Medication Adjustments/Labs and Tests Ordered: Current medicines are reviewed at length with the patient today.  Concerns regarding medicines are outlined  above.  Orders Placed This Encounter  Procedures   LONG TERM MONITOR (3-14 DAYS)   EKG 12-Lead   No orders of the defined types were placed in this encounter.   Patient Instructions  Medication Instructions:   Your physician recommends that you continue on your current medications as directed. Please refer to the Current Medication list given to you today.  *If you need a refill on your cardiac medications before your next appointment, please call your pharmacy*   Lab Work:  None Ordered  If you have labs (blood work) drawn today and your tests are completely normal, you will receive your results only by: [place] (if you have MyChart) OR A paper copy in the mail If you have any lab test that is abnormal or we need to change your treatment, we will call you to review the results.   Testing/Procedures:  Your physician has recommended that you wear a Zio monitor.   This monitor is a medical device that records the heart's electrical activity. Doctors most often use these monitors to diagnose arrhythmias. Arrhythmias are problems with the speed or rhythm of the heartbeat. The monitor is a small device applied to your chest. You can wear one while you do your normal daily activities. While wearing this monitor if you have any symptoms to push the button and record what you felt.  Once you have worn this monitor for the period of time provider prescribed (Usually 14 days), you will return the monitor device in the postage paid box. Once it is returned they will download the data collected and provide [place] with a report which the provider will then review and we will call you with those results. Important tips:  Avoid showering during the first 24 hours of wearing the monitor. Avoid excessive sweating to help maximize wear time. Do not submerge the device, no hot tubs, and no swimming pools. Keep any lotions or oils away from the patch. After 24 hours you may shower with the patch on. Take brief showers with your back facing the shower head.  Do not remove patch once it has been placed because that will interrupt data and decrease adhesive wear time. Push the button when you have any symptoms and write down what you were feeling. Once you have completed wearing your monitor, remove and place into box which has postage paid and place in your outgoing mailbox.  If for some reason you have misplaced your box then call our office and we can provide another box and/or mail it off for you.      Follow-Up: At [facility], you and your health needs are our priority.  As part of our continuing mission to provide you with exceptional heart care, we have created designated Provider Care Teams.  These Care Teams include your primary Cardiologist (physician) and Advanced Practice Providers (APPs -  Physician Assistants and Nurse Practitioners) who all work together to provide you with the care you need, when you need it.  We recommend signing up for the patient portal called "MyChart".  Sign up information is provided on this After Visit Summary.  MyChart is used to connect with patients for Virtual Visits (Telemedicine).  Patients are able to view lab/test results, encounter notes, upcoming appointments, etc.  Non-urgent messages can be sent to your provider as well.   To learn  more about what you can do with MyChart, go to [email or web address].    Your next appointment:   8 week(s)  Provider:   You may see Kate Sable, MD or one of the following Advanced Practice Providers on your designated Care Team:   Murray Hodgkins, NP Christell Faith, PA-C Cadence Kathlen Mody, PA-C Gerrie Nordmann, NP   Signed, Kate Sable, MD  09/14/2022 12:33 PM    [place]

## 2022-09-17 DIAGNOSIS — R002 Palpitations: Secondary | ICD-10-CM

## 2022-10-07 ENCOUNTER — Telehealth: Payer: Self-pay | Admitting: Family Medicine

## 2022-10-07 ENCOUNTER — Other Ambulatory Visit: Payer: Self-pay | Admitting: Physician Assistant

## 2022-10-07 MED ORDER — HYDROCHLOROTHIAZIDE 25 MG PO TABS: 25.0000 mg | ORAL_TABLET | Freq: Every day | ORAL | 0 refills | Status: AC

## 2022-10-07 NOTE — Telephone Encounter (Signed)
[facility] faxed refill request for the following medications:  hydrochlorothiazide (HYDRODIURIL) 25 MG tablet    Please advise.

## 2022-10-07 NOTE — Telephone Encounter (Signed)
Unable to refill per protocol, Rx request is too soon. Last refill 10/07/22 for 30 days.  Requested Prescriptions  Pending Prescriptions Disp Refills   hydrochlorothiazide (HYDRODIURIL) 25 MG tablet [Pharmacy Med Name: HYDROCHLOROTHIAZIDE 25MG TABLETS] 90 tablet     Sig: TAKE 1 TABLET(25 MG) BY MOUTH DAILY     Cardiovascular: Diuretics - Thiazide Failed - 10/07/2022  9:49 AM      Failed - Cr in normal range and within 180 days    Creat  Date Value Ref Range Status  06/17/2017 0.99 0.50 - 0.99 mg/dL Final    Comment:    For patients >46 years of age, the reference limit for Creatinine is approximately 13% higher for people identified as African-American. .    Creatinine, Ser  Date Value Ref Range Status  07/27/2022 1.18 (H) 0.44 - 1.00 mg/dL Final         Failed - K in normal range and within 180 days    Potassium  Date Value Ref Range Status  07/27/2022 3.1 (L) 3.5 - 5.1 mmol/L Final  01/20/2014 3.3 (L) 3.5 - 5.1 mmol/L Final         Failed - Last BP in normal range    BP Readings from Last 1 Encounters:  09/14/22 (!) 144/88         Failed - Valid encounter within last 6 months    Recent Outpatient Visits           8 months ago Annual physical exam   [place] Eulas Post, MD   1 year ago Annual physical exam   [facility] Eulas Post, MD   2 years ago Essential hypertension   [place] Eulas Post, MD   2 years ago Annual physical exam   [facility] Eulas Post, MD   3 years ago Abdominal discomfort   [place] Eulas Post, MD       Future Appointments             In 1 month Agbor-Etang, Aaron Edelman, MD [place] [facility] [place]   In 3 months Ralene Bathe, MD [place]            Passed - Na in normal range and within 180 days    Sodium  Date Value  Ref Range Status  07/27/2022 138 135 - 145 mmol/L Final  02/09/2022 141 134 - 144 mmol/L Final  01/20/2014 139 136 - 145 mmol/L Final

## 2022-10-09 ENCOUNTER — Telehealth: Payer: Self-pay

## 2022-10-09 DIAGNOSIS — R072 Precordial pain: Secondary | ICD-10-CM

## 2022-10-09 DIAGNOSIS — I471 Supraventricular tachycardia, unspecified: Secondary | ICD-10-CM

## 2022-10-09 NOTE — Telephone Encounter (Signed)
Called patient to review results.   Patient is agreeable to having the Echocardiogram completed but would like to hold off on starting any medication at this time.   Patient reports that her Palpitations has not been that bad since increasing her fluid intake.

## 2022-10-09 NOTE — Telephone Encounter (Signed)
-----   Message from Kate Sable, MD sent at 10/09/2022 10:36 AM EST ----- Paroxysmal SVTs noted on the monitor, might explain symptoms of palpitations.  Start Toprol-XL 25 mg daily.  Order echocardiogram.  Keep follow-up appointment.

## 2022-11-05 ENCOUNTER — Other Ambulatory Visit: Payer: Self-pay | Admitting: Physician Assistant

## 2022-11-16 ENCOUNTER — Ambulatory Visit: Payer: BC Managed Care – PPO | Admitting: Cardiology

## 2022-11-25 ENCOUNTER — Ambulatory Visit: Payer: BC Managed Care – PPO | Attending: Cardiology

## 2022-11-25 DIAGNOSIS — R072 Precordial pain: Secondary | ICD-10-CM

## 2022-11-25 DIAGNOSIS — I471 Supraventricular tachycardia, unspecified: Secondary | ICD-10-CM | POA: Diagnosis not present

## 2022-11-25 LAB — ECHOCARDIOGRAM COMPLETE
AR max vel: 2.5 cm2
AV Area VTI: 2.6 cm2
AV Area mean vel: 2.42 cm2
AV Mean grad: 4 mmHg
AV Peak grad: 7.5 mmHg
Ao pk vel: 1.37 m/s
Area-P 1/2: 3.77 cm2
S' Lateral: 3.8 cm

## 2022-12-08 ENCOUNTER — Other Ambulatory Visit: Payer: Self-pay | Admitting: Family Medicine

## 2022-12-08 DIAGNOSIS — Z1231 Encounter for screening mammogram for malignant neoplasm of breast: Secondary | ICD-10-CM

## 2022-12-11 ENCOUNTER — Ambulatory Visit: Payer: BC Managed Care – PPO | Attending: Cardiology | Admitting: Cardiology

## 2022-12-11 ENCOUNTER — Encounter: Payer: Self-pay | Admitting: Cardiology

## 2022-12-11 VITALS — BP 140/90 | HR 88 | Ht 70.0 in | Wt 233.0 lb

## 2022-12-11 DIAGNOSIS — I10 Essential (primary) hypertension: Secondary | ICD-10-CM

## 2022-12-11 DIAGNOSIS — I471 Supraventricular tachycardia, unspecified: Secondary | ICD-10-CM

## 2022-12-11 NOTE — Progress Notes (Signed)
Cardiology Office Note:    Date:  12/11/2022   ID:  Jaime Ramirez, DOB 29-Jun-1951, MRN [id number]  PCP:  Bosie Clos, MD   [facility] Cardiologist:  Debbe Odea, MD     Referring MD: Bosie Clos, MD   Chief Complaint  Patient presents with   Other    Follow up post testing. Meds reviewed verbally with patient.       History of Present Illness:    Jaime Ramirez is a 72 y.o. female with a hx of hypertension, essential tremor, renal cell carcinoma s/p left nephrectomy 2020 who presents for follow-up.    Previously seen due to palpitations.  Cardiac monitor was placed to evaluate any significant arrhythmias, paroxysmal SVT was noted.  Echocardiogram 11/2022 showed normal systolic function EF 55 to 60%, diastolic function normal.  States feeling much better, denies any episodes of palpitations.  Blood pressure is well-controlled at home with systolic in the 120s to low 130s.   Past Medical History:  Diagnosis Date   Arthritis    right foot, left ankle   Cancer    kidney   Essential tremor    GERD (gastroesophageal reflux disease)    Vertigo    random episodes. none in about 1 month    Past Surgical History:  Procedure Laterality Date   ANKLE SURGERY     APPENDECTOMY     BREAST BIOPSY Right 06/04/2015   demonstrating fibroadenomatous change with stromal hyalinization and calcifications   BREAST BIOPSY Right 11/13/2021   affirm bx, ribbon marker, path pending   CATARACT EXTRACTION W/PHACO Left 05/12/2017   Procedure: CATARACT EXTRACTION PHACO AND INTRAOCULAR LENS PLACEMENT (IOC)LEFT;  Surgeon: Lockie Mola, MD;  Location: [facility];  Service: Ophthalmology;  Laterality: Left;  IVA TOPICAL LEFT   CATARACT EXTRACTION W/PHACO Right 06/09/2017   Procedure: CATARACT EXTRACTION PHACO AND INTRAOCULAR LENS PLACEMENT (IOC)  RIGHT;  Surgeon: Lockie Mola, MD;  Location: [facility];  Service:  Ophthalmology;  Laterality: Right;   COLONOSCOPY WITH PROPOFOL N/A 02/21/2016   Procedure: COLONOSCOPY WITH PROPOFOL;  Surgeon: Midge Minium, MD;  Location: [facility];  Service: Endoscopy;  Laterality: N/A;   HERNIA REPAIR     KIDNEY SURGERY Left    kidney removal   TONSILLECTOMY AND ADENOIDECTOMY     TOTAL KNEE ARTHROPLASTY Bilateral    TUBAL LIGATION      Current Medications: No outpatient medications have been marked as taking for the 12/11/22 encounter (Office Visit) with Debbe Odea, MD.     Allergies:   Codeine and Hydromorphone   Social History   Socioeconomic History   Marital status: Divorced    Spouse name: Not on file   Number of children: Not on file   Years of education: Not on file   Highest education level: Not on file  Occupational History   Not on file  Tobacco Use   Smoking status: Never    Passive exposure: Past   Smokeless tobacco: Never  Vaping Use   Vaping Use: Never used  Substance and Sexual Activity   Alcohol use: Yes    Alcohol/week: 0.0 standard drinks of alcohol    Comment: 2 drinks a month   Drug use: No   Sexual activity: Not on file  Other Topics Concern   Not on file  Social History Narrative   Not on file   Social Determinants of Health   Financial Resource Strain: Not on file  Food Insecurity:  Not on file  Transportation Needs: Not on file  Physical Activity: Not on file  Stress: Not on file  Social Connections: Not on file     Family History: The patient's family history includes Atrial fibrillation in her mother; Brain cancer in her paternal aunt; Breast cancer in her maternal aunt and paternal aunt; Cancer in her maternal grandfather; Heart disease in her mother and sister; Hypertension in her father; Kidney disease in her father; Lupus in her sister; Stroke in her maternal grandmother and sister.  ROS:   Please see the history of present illness.     All other systems reviewed and are  negative.  EKGs/Labs/Other Studies Reviewed:    The following studies were reviewed today:   EKG:  EKG not ordered today.    Recent Labs: 02/09/2022: ALT 13 07/27/2022: BUN 19; Creatinine, Ser 1.18; Hemoglobin 14.0; Magnesium 1.9; Platelets 309; Potassium 3.1; Sodium 138; TSH 2.623  Recent Lipid Panel    Component Value Date/Time   CHOL 173 02/09/2022 1012   TRIG 115 02/09/2022 1012   HDL 54 02/09/2022 1012   CHOLHDL 3.2 02/09/2022 1012   LDLCALC 98 02/09/2022 1012     Risk Assessment/Calculations:         Physical Exam:    VS:  BP (!) 140/90 (BP Location: Left Arm, Patient Position: Sitting, Cuff Size: Normal)   Pulse 88   Ht  (1.778 m)   Wt 233 lb (105.7 kg)   SpO2 98%   BMI 33.43 kg/m     Wt Readings from Last 3 Encounters:  12/11/22 233 lb (105.7 kg)  09/14/22 231 lb 3.2 oz (104.9 kg)  07/27/22 228 lb (103.4 kg)     GEN:  Well nourished, well developed in no acute distress HEENT: Normal NECK: No JVD; No carotid bruits CARDIAC: RRR, no murmurs, rubs, gallops RESPIRATORY:  Clear to auscultation without rales, wheezing or rhonchi  ABDOMEN: Soft, non-tender, non-distended MUSCULOSKELETAL:  No edema; No deformity  SKIN: Warm and dry NEUROLOGIC:  Alert and oriented x 3 PSYCHIATRIC:  Normal affect   ASSESSMENT:    1. Paroxysmal SVT (supraventricular tachycardia)   2. Primary hypertension    PLAN:    In order of problems listed above:  Palpitations, symptoms resolved.  Cardiac monitor showed paroxysmal SVT.  No significant sustained arrhythmias.  EF 55 to 60%.  Hypertension, BP elevated, usually controlled.  Continue HCTZ 25 mg daily.  Follow-up as needed.  Consider beta-blocker if symptoms return.     Medication Adjustments/Labs and Tests Ordered: Current medicines are reviewed at length with the patient today.  Concerns regarding medicines are outlined above.  No orders of the defined types were placed in this encounter.  No orders of the  defined types were placed in this encounter.   There are no Patient Instructions on file for this visit.   Signed, Debbe Odea, MD  12/11/2022 4:47 PM    [facility]

## 2022-12-14 NOTE — Patient Instructions (Signed)
Medication Instructions:   Your physician recommends that you continue on your current medications as directed. Please refer to the Current Medication list given to you today.  *If you need a refill on your cardiac medications before your next appointment, please call your pharmacy*   Lab Work:  None Ordered  If you have labs (blood work) drawn today and your tests are completely normal, you will receive your results only by: MyChart Message (if you have MyChart) OR A paper copy in the mail If you have any lab test that is abnormal or we need to change your treatment, we will call you to review the results.   Testing/Procedures:  1., None Ordered   Follow-Up: At [facility], you and your health needs are our priority.  As part of our continuing mission to provide you with exceptional heart care, we have created designated Provider Care Teams.  These Care Teams include your primary Cardiologist (physician) and Advanced Practice Providers (APPs -  Physician Assistants and Nurse Practitioners) who all work together to provide you with the care you need, when you need it.  We recommend signing up for the patient portal called "MyChart".  Sign up information is provided on this After Visit Summary.  MyChart is used to connect with patients for Virtual Visits (Telemedicine).  Patients are able to view lab/test results, encounter notes, upcoming appointments, etc.  Non-urgent messages can be sent to your provider as well.   To learn more about what you can do with MyChart, go to [email or web address].    Your next appointment:    As needed    

## 2022-12-31 ENCOUNTER — Ambulatory Visit
Admission: RE | Admit: 2022-12-31 | Discharge: 2022-12-31 | Disposition: A | Discharge status: Home / Self Care | Payer: BC Managed Care – PPO | Source: Ambulatory Visit | Attending: Family Medicine | Admitting: Family Medicine

## 2022-12-31 ENCOUNTER — Ambulatory Visit: Payer: BC Managed Care – PPO | Admitting: Dermatology

## 2022-12-31 DIAGNOSIS — Z1231 Encounter for screening mammogram for malignant neoplasm of breast: Secondary | ICD-10-CM | POA: Insufficient documentation

## 2023-01-07 ENCOUNTER — Ambulatory Visit: Payer: BC Managed Care – PPO | Admitting: Dermatology

## 2023-01-07 VITALS — BP 166/90

## 2023-01-07 DIAGNOSIS — Z79899 Other long term (current) drug therapy: Secondary | ICD-10-CM | POA: Diagnosis not present

## 2023-01-07 DIAGNOSIS — L2089 Other atopic dermatitis: Secondary | ICD-10-CM | POA: Diagnosis not present

## 2023-01-07 DIAGNOSIS — L409 Psoriasis, unspecified: Secondary | ICD-10-CM | POA: Diagnosis not present

## 2023-01-07 MED ORDER — OPZELURA 1.5 % EX CREA: 1.0000 "application " | TOPICAL_CREAM | Freq: Every day | CUTANEOUS | 3 refills | Status: DC

## 2023-01-07 NOTE — Patient Instructions (Addendum)
Your prescription was sent to [facility] in [place]. A representative from [facility] will contact you within 3 business hours to verify your address and insurance information to schedule a free delivery. If for any reason you do not receive a phone call from them, please reach out to them. Their phone number is [phone] and their hours are Monday-Friday 9:00 am-5:00 pm.      Due to recent changes in healthcare laws, you may see results of your pathology and/or laboratory studies on MyChart before the doctors have had a chance to review them. We understand that in some cases there may be results that are confusing or concerning to you. Please understand that not all results are received at the same time and often the doctors may need to interpret multiple results in order to provide you with the best plan of care or course of treatment. Therefore, we ask that you please give us 2 business days to thoroughly review all your results before contacting the office for clarification. Should we see a critical lab result, you will be contacted sooner.   If You Need Anything After Your Visit  If you have any questions or concerns for your doctor, please call our main line at [phone] and press option 4 to reach your doctor's medical assistant. If no one answers, please leave a voicemail as directed and we will return your call as soon as possible. Messages left after 4 pm will be answered the following business day.   You may also send us a message via MyChart. We typically respond to MyChart messages within 1-2 business days.  For prescription refills, please ask your pharmacy to contact our office. Our fax number is [phone].  If you have an urgent issue when the clinic is closed that cannot wait until the next business day, you can page your doctor at the number below.    Please note that while we do our best to be available for urgent issues outside of office hours, we are not  available 24/7.   If you have an urgent issue and are unable to reach us, you may choose to seek medical care at your doctor's office, retail clinic, urgent care center, or emergency room.  If you have a medical emergency, please immediately call 911 or go to the emergency department.  Pager Numbers  - Dr. Kowalski: [phone]  - Dr. Moye: [phone]  - Dr. Stewart: [phone]  In the event of inclement weather, please call our main line at [phone] for an update on the status of any delays or closures.  Dermatology Medication Tips: Please keep the boxes that topical medications come in in order to help keep track of the instructions about where and how to use these. Pharmacies typically print the medication instructions only on the boxes and not directly on the medication tubes.   If your medication is too expensive, please contact our office at [phone] option 4 or send us a message through MyChart.   We are unable to tell what your co-pay for medications will be in advance as this is different depending on your insurance coverage. However, we may be able to find a substitute medication at lower cost or fill out paperwork to get insurance to cover a needed medication.   If a prior authorization is required to get your medication covered by your insurance company, please allow us 1-2 business days to complete this process.  Drug prices often vary depending on where the prescription is   filled and some pharmacies may offer cheaper prices.  The website www.goodrx.com contains coupons for medications through different pharmacies. The prices here do not account for what the cost may be with help from insurance (it may be cheaper with your insurance), but the website can give you the price if you did not use any insurance.  - You can print the associated coupon and take it with your prescription to the pharmacy.  - You may also stop by our office during regular business hours  and pick up a GoodRx coupon card.  - If you need your prescription sent electronically to a different pharmacy, notify our office through [facility] MyChart or by phone at [phone] option 4.     Si Usted Necesita Algo Despus de Su Visita  Tambin puede enviarnos un mensaje a travs de MyChart. Por lo general respondemos a los mensajes de MyChart en el transcurso de 1 a 2 das hbiles.  Para renovar recetas, por favor pida a su farmacia que se ponga en contacto con nuestra oficina. Nuestro nmero de fax es el [phone].  Si tiene un asunto urgente cuando la clnica est cerrada y que no puede esperar hasta el siguiente da hbil, puede llamar/localizar a su doctor(a) al nmero que aparece a continuacin.   Por favor, tenga en cuenta que aunque hacemos todo lo posible para estar disponibles para asuntos urgentes fuera del horario de oficina, no estamos disponibles las 24 horas del da, los 7 das de la semana.   Si tiene un problema urgente y no puede comunicarse con nosotros, puede optar por buscar atencin mdica  en el consultorio de su doctor(a), en una clnica privada, en un centro de atencin urgente o en una sala de emergencias.  Si tiene una emergencia mdica, por favor llame inmediatamente al 911 o vaya a la sala de emergencias.  Nmeros de bper  - Dr. Kowalski: [phone]  - Dra. Moye: [phone]  - Dra. Stewart: [phone]  En caso de inclemencias del tiempo, por favor llame a nuestra lnea principal al [phone] para una actualizacin sobre el estado de cualquier retraso o cierre.  Consejos para la medicacin en dermatologa: Por favor, guarde las cajas en las que vienen los medicamentos de uso tpico para ayudarle a seguir las instrucciones sobre dnde y cmo usarlos. Las farmacias generalmente imprimen las instrucciones del medicamento slo en las cajas y no directamente en los tubos del medicamento.   Si su medicamento es muy caro, por favor, pngase  en contacto con nuestra oficina llamando al [phone] y presione la opcin 4 o envenos un mensaje a travs de MyChart.   No podemos decirle cul ser su copago por los medicamentos por adelantado ya que esto es diferente dependiendo de la cobertura de su seguro. Sin embargo, es posible que podamos encontrar un medicamento sustituto a menor costo o llenar un formulario para que el seguro cubra el medicamento que se considera necesario.   Si se requiere una autorizacin previa para que su compaa de seguros cubra su medicamento, por favor permtanos de 1 a 2 das hbiles para completar este proceso.  Los precios de los medicamentos varan con frecuencia dependiendo del lugar de dnde se surte la receta y alguna farmacias pueden ofrecer precios ms baratos.  El sitio web www.goodrx.com tiene cupones para medicamentos de diferentes farmacias. Los precios aqu no tienen en cuenta lo que podra costar con la ayuda del seguro (puede ser ms barato con su seguro), pero el sitio web puede darle   el precio si no utiliz ningn seguro.  - Puede imprimir el cupn correspondiente y llevarlo con su receta a la farmacia.  - Tambin puede pasar por nuestra oficina durante el horario de atencin regular y recoger una tarjeta de cupones de GoodRx.  - Si necesita que su receta se enve electrnicamente a una farmacia diferente, informe a nuestra oficina a travs de MyChart de [facility] o por telfono llamando al [phone] y presione la opcin 4.  

## 2023-01-07 NOTE — Progress Notes (Signed)
   Follow-Up Visit   Subjective  Jaime Ramirez is a 72 y.o. female who presents for the following: Psoriasis follow up of hands, feet, elbows - Zoryve cream qd, Urea 40% cream qd - it is about the same as it was the last time she was here.   The following portions of the chart were reviewed this encounter and updated as appropriate: medications, allergies, medical history  Review of Systems:  No other skin or systemic complaints except as noted in HPI or Assessment and Plan.  Objective  Well appearing patient in no apparent distress; mood and affect are within normal limits.   A focused examination was performed of the following areas:   Relevant exam findings are noted in the Assessment and Plan.    Assessment & Plan   PSORIASIS with Atopic Dermatitis overlap Exam: Well-demarcated erythematous papules/plaques with silvery scale, guttate pink scaly papules       Chronic and persistent condition with duration or expected duration over one year. Condition is symptomatic/ bothersome to patient. Not currently at goal.   patient denies joint pain  Psoriasis is a chronic non-curable, but treatable genetic/hereditary disease that may have other systemic features affecting other organ systems such as joints (Psoriatic Arthritis). It is associated with an increased risk of inflammatory bowel disease, heart disease, non-alcoholic fatty liver disease, and depression.  Treatments include light and laser treatments; topical medications; and systemic medications including oral and injectables.  Treatment Plan: Continue Zoryve cream qd  Start Opzelura cream qd  Continue Urea 40% cream to any thick areas     Return in about 4 months (around 05/10/2023) for Psoriasis.  I, Joanie Coddington, CMA, am acting as scribe for Armida Sans, MD .   Documentation: I have reviewed the above documentation for accuracy and completeness, and I agree with the above.  Armida Sans, MD

## 2023-01-21 ENCOUNTER — Encounter: Payer: Self-pay | Admitting: Dermatology

## 2023-02-11 ENCOUNTER — Encounter: Payer: BC Managed Care – PPO | Admitting: Family Medicine

## 2023-10-20 ENCOUNTER — Encounter: Payer: Self-pay | Admitting: Dermatology

## 2023-10-20 ENCOUNTER — Ambulatory Visit: Payer: BC Managed Care – PPO | Admitting: Dermatology

## 2023-10-20 DIAGNOSIS — C44619 Basal cell carcinoma of skin of left upper limb, including shoulder: Secondary | ICD-10-CM | POA: Diagnosis not present

## 2023-10-20 DIAGNOSIS — Z79899 Other long term (current) drug therapy: Secondary | ICD-10-CM

## 2023-10-20 DIAGNOSIS — W908XXA Exposure to other nonionizing radiation, initial encounter: Secondary | ICD-10-CM | POA: Diagnosis not present

## 2023-10-20 DIAGNOSIS — L409 Psoriasis, unspecified: Secondary | ICD-10-CM | POA: Diagnosis not present

## 2023-10-20 DIAGNOSIS — L578 Other skin changes due to chronic exposure to nonionizing radiation: Secondary | ICD-10-CM | POA: Diagnosis not present

## 2023-10-20 DIAGNOSIS — C4491 Basal cell carcinoma of skin, unspecified: Secondary | ICD-10-CM

## 2023-10-20 DIAGNOSIS — L821 Other seborrheic keratosis: Secondary | ICD-10-CM

## 2023-10-20 DIAGNOSIS — Z7189 Other specified counseling: Secondary | ICD-10-CM

## 2023-10-20 DIAGNOSIS — D492 Neoplasm of unspecified behavior of bone, soft tissue, and skin: Secondary | ICD-10-CM

## 2023-10-20 DIAGNOSIS — D692 Other nonthrombocytopenic purpura: Secondary | ICD-10-CM

## 2023-10-20 DIAGNOSIS — D485 Neoplasm of uncertain behavior of skin: Secondary | ICD-10-CM

## 2023-10-20 HISTORY — DX: Basal cell carcinoma of skin, unspecified: C44.91

## 2023-10-20 NOTE — Patient Instructions (Addendum)

## 2023-10-20 NOTE — Progress Notes (Unsigned)
 Follow-Up Visit   Subjective  Jaime Ramirez is a 73 y.o. female who presents for the following: Psoriasis of the hands, feet, and elbows- manageable with Zoryve cream once a day 2-3 times per week, and urea 40% cream once a day 2-3 times per week.  Pt has noticed scaly patches on the back that she would like checked today.  The patient has spots, moles and lesions to be evaluated, some may be new or changing and the patient may have concern these could be cancer.  The following portions of the chart were reviewed this encounter and updated as appropriate: medications, allergies, medical history  Review of Systems:  No other skin or systemic complaints except as noted in HPI or Assessment and Plan.  Objective  Well appearing patient in no apparent distress; mood and affect are within normal limits.  Areas Examined: The face, hands, feet  Relevant exam findings are noted in the Assessment and Plan.    L post shoulder 1.1 cm scaly pink patch   Assessment & Plan   NEOPLASM OF UNCERTAIN BEHAVIOR OF SKIN L post shoulder Epidermal / dermal shaving  Lesion diameter (cm):  1.1 Informed consent: discussed and consent obtained   Timeout: patient name, date of birth, surgical site, and procedure verified   Procedure prep:  Patient was prepped and draped in usual sterile fashion Prep type:  Isopropyl alcohol Anesthesia: the lesion was anesthetized in a standard fashion   Anesthetic:  1% lidocaine w/ epinephrine 1-100,000 buffered w/ 8.4% NaHCO3 Instrument used: flexible razor blade   Hemostasis achieved with: pressure, aluminum chloride and electrodesiccation   Outcome: patient tolerated procedure well   Post-procedure details: sterile dressing applied and wound care instructions given   Dressing type: bandage and petrolatum    Destruction of lesion Complexity: extensive   Destruction method: electrodesiccation and curettage   Informed consent: discussed and consent obtained    Timeout:  patient name, date of birth, surgical site, and procedure verified Procedure prep:  Patient was prepped and draped in usual sterile fashion Prep type:  Isopropyl alcohol Anesthesia: the lesion was anesthetized in a standard fashion   Anesthetic:  1% lidocaine w/ epinephrine 1-100,000 buffered w/ 8.4% NaHCO3 Curettage performed in three different directions: Yes   Electrodesiccation performed over the curetted area: Yes   Lesion length (cm):  1.1 Lesion width (cm):  1.1 Margin per side (cm):  0.2 Final wound size (cm):  1.5 Hemostasis achieved with:  pressure, aluminum chloride and electrodesiccation Outcome: patient tolerated procedure well with no complications   Post-procedure details: sterile dressing applied and wound care instructions given   Dressing type: bandage and petrolatum   Specimen 1 - Surgical pathology Differential Diagnosis: D48.5 r/o BCC ED&C today Check Margins: No ACTINIC SKIN DAMAGE   SEBORRHEIC KERATOSIS   PURPURA (HCC)   PSORIASIS   Related Medications Roflumilast (ZORYVE) 0.3 % CREA Qd to aa psoriasis on hands, left elbow and feet prn flares COUNSELING AND COORDINATION OF CARE   MEDICATION MANAGEMENT    PSORIASIS Scale of the feet L>R, minimal scale of the elbows. 10% BSA.  Chronic condition with duration or expected duration over one year. Currently well-controlled.  Patient does have joint pain in the toes, ankles, L elbow, and shoulders  Treatment Plan: Continue Zoryve cream may increase to BID.  Continue urea 40% cream may use QD-BID PRN.   Counseling on psoriasis and coordination of care  psoriasis is a chronic non-curable, but treatable genetic/hereditary disease that may  have other systemic features affecting other organ systems such as joints (Psoriatic Arthritis). It is associated with an increased risk of inflammatory bowel disease, heart disease, non-alcoholic fatty liver disease, and depression.  Treatments include  light and laser treatments; topical medications; and systemic medications including oral and injectables.  SEBORRHEIC KERATOSIS - Stuck-on, waxy, tan-brown papules and/or plaques  - Benign-appearing - Discussed benign etiology and prognosis. - Observe - Call for any changes  ACTINIC DAMAGE - chronic, secondary to cumulative UV radiation exposure/sun exposure over time - diffuse scaly erythematous macules with underlying dyspigmentation - Recommend daily broad spectrum sunscreen SPF 30+ to sun-exposed areas, reapply every 2 hours as needed.  - Recommend staying in the shade or wearing long sleeves, sun glasses (UVA+UVB protection) and wide brim hats (4-inch brim around the entire circumference of the hat). - Call for new or changing lesions.  Purpura - Chronic; persistent and recurrent.  Treatable, but not curable. - Violaceous macules and patches - Benign - Related to trauma, age, sun damage and/or use of blood thinners, chronic use of topical and/or oral steroids - Observe - Can use OTC arnica containing moisturizer such as Dermend Bruise Formula if desired - Call for worsening or other concerns  Return in about 6 months (around 04/18/2024) for TBSE.  Maylene Roes, CMA, am acting as scribe for Armida Sans, MD .  Documentation: I have reviewed the above documentation for accuracy and completeness, and I agree with the above.  Armida Sans, MD

## 2023-10-21 ENCOUNTER — Telehealth: Payer: Self-pay

## 2023-10-21 ENCOUNTER — Encounter: Payer: Self-pay | Admitting: Dermatology

## 2023-10-21 LAB — SURGICAL PATHOLOGY

## 2023-10-21 NOTE — Telephone Encounter (Signed)
-----   Message from Armida Sans sent at 10/21/2023  5:13 PM EST ----- FINAL DIAGNOSIS        1. Skin, L post shoulder :       SUPERFICIAL AND NODULAR BASAL CELL CARCINOMA, ULCERATED   Cancer = BCC Already treated Recheck next visit

## 2023-10-21 NOTE — Telephone Encounter (Signed)
 Advised pt of bx result/sh ?

## 2023-11-17 ENCOUNTER — Other Ambulatory Visit: Payer: Self-pay | Admitting: Family Medicine

## 2023-11-17 DIAGNOSIS — Z1231 Encounter for screening mammogram for malignant neoplasm of breast: Secondary | ICD-10-CM

## 2023-12-31 ENCOUNTER — Encounter (HOSPITAL_COMMUNITY): Payer: Self-pay

## 2024-01-07 ENCOUNTER — Ambulatory Visit
Admission: RE | Admit: 2024-01-07 | Discharge: 2024-01-07 | Disposition: A | Discharge status: Home / Self Care | Source: Ambulatory Visit | Attending: Family Medicine | Admitting: Family Medicine

## 2024-01-07 DIAGNOSIS — Z1231 Encounter for screening mammogram for malignant neoplasm of breast: Secondary | ICD-10-CM | POA: Insufficient documentation

## 2024-04-18 ENCOUNTER — Ambulatory Visit: Payer: BC Managed Care – PPO | Admitting: Dermatology

## 2024-04-18 ENCOUNTER — Encounter: Payer: Self-pay | Admitting: Dermatology

## 2024-04-18 DIAGNOSIS — W908XXA Exposure to other nonionizing radiation, initial encounter: Secondary | ICD-10-CM | POA: Diagnosis not present

## 2024-04-18 DIAGNOSIS — L409 Psoriasis, unspecified: Secondary | ICD-10-CM

## 2024-04-18 DIAGNOSIS — Z79899 Other long term (current) drug therapy: Secondary | ICD-10-CM

## 2024-04-18 DIAGNOSIS — L578 Other skin changes due to chronic exposure to nonionizing radiation: Secondary | ICD-10-CM

## 2024-04-18 DIAGNOSIS — L820 Inflamed seborrheic keratosis: Secondary | ICD-10-CM | POA: Diagnosis not present

## 2024-04-18 DIAGNOSIS — L814 Other melanin hyperpigmentation: Secondary | ICD-10-CM

## 2024-04-18 DIAGNOSIS — L209 Atopic dermatitis, unspecified: Secondary | ICD-10-CM

## 2024-04-18 DIAGNOSIS — D1801 Hemangioma of skin and subcutaneous tissue: Secondary | ICD-10-CM

## 2024-04-18 DIAGNOSIS — Z1283 Encounter for screening for malignant neoplasm of skin: Secondary | ICD-10-CM | POA: Diagnosis not present

## 2024-04-18 DIAGNOSIS — Z7189 Other specified counseling: Secondary | ICD-10-CM

## 2024-04-18 DIAGNOSIS — Z85828 Personal history of other malignant neoplasm of skin: Secondary | ICD-10-CM

## 2024-04-18 DIAGNOSIS — D692 Other nonthrombocytopenic purpura: Secondary | ICD-10-CM

## 2024-04-18 DIAGNOSIS — D229 Melanocytic nevi, unspecified: Secondary | ICD-10-CM

## 2024-04-18 MED ORDER — ZORYVE 0.3 % EX CREA: TOPICAL_CREAM | CUTANEOUS | 6 refills | Status: AC

## 2024-04-18 MED ORDER — OPZELURA 1.5 % EX CREA: 1.0000 | TOPICAL_CREAM | Freq: Every day | CUTANEOUS | 6 refills | Status: DC

## 2024-04-18 NOTE — Patient Instructions (Addendum)
 Your prescription was sent to [facility] in [place]. A representative from [facility] will contact you within 3 business hours to verify your address and insurance information to schedule a free delivery. If for any reason you do not receive a phone call from them, please reach out to them. Their phone number is [phone] and their hours are Monday-Friday 9:00 am-5:00 pm.     For bruising  Can use OTC or arnica pills or  arnica containing moisturizer such as Dermend Bruise     Seborrheic Keratosis  What causes seborrheic keratoses? Seborrheic keratoses are harmless, common skin growths that first appear during adult life.  As time goes by, more growths appear.  Some people may develop a large number of them.  Seborrheic keratoses appear on both covered and uncovered body parts.  They are not caused by sunlight.  The tendency to develop seborrheic keratoses can be inherited.  They vary in color from skin-colored to gray, brown, or even black.  They can be either smooth or have a rough, warty surface.   Seborrheic keratoses are superficial and look as if they were stuck on the skin.  Under the microscope this type of keratosis looks like layers upon layers of skin.  That is why at times the top layer may seem to fall off, but the rest of the growth remains and re-grows.    Treatment Seborrheic keratoses do not need to be treated, but can easily be removed in the office.  Seborrheic keratoses often cause symptoms when they rub on clothing or jewelry.  Lesions can be in the way of shaving.  If they become inflamed, they can cause itching, soreness, or burning.  Removal of a seborrheic keratosis can be accomplished by freezing, burning, or surgery. If any spot bleeds, scabs, or grows rapidly, please return to have it checked, as these can be an indication of a skin cancer.   Cryotherapy Aftercare  Wash gently with soap and water  everyday.   Apply Vaseline and Band-Aid daily until  healed.    Melanoma ABCDEs  Melanoma is the most dangerous type of skin cancer, and is the leading cause of death from skin disease.  You are more likely to develop melanoma if you: Have light-colored skin, light-colored eyes, or red or blond hair Spend a lot of time in the sun Tan regularly, either outdoors or in a tanning bed Have had blistering sunburns, especially during childhood Have a close family member who has had a melanoma Have atypical moles or large birthmarks  Early detection of melanoma is key since treatment is typically straightforward and cure rates are extremely high if we catch it early.   The first sign of melanoma is often a change in a mole or a new dark spot.  The ABCDE system is a way of remembering the signs of melanoma.  A for asymmetry:  The two halves do not match. B for border:  The edges of the growth are irregular. C for color:  A mixture of colors are present instead of an even brown color. D for diameter:  Melanomas are usually (but not always) greater than 6mm - the size of a pencil eraser. E for evolution:  The spot keeps changing in size, shape, and color.  Please check your skin once per month between visits. You can use a small mirror in front and a large mirror behind you to keep an eye on the back side or your body.   If you see  any new or changing lesions before your next follow-up, please call to schedule a visit.  Please continue daily skin protection including broad spectrum sunscreen SPF 30+ to sun-exposed areas, reapplying every 2 hours as needed when you're outdoors.   Staying in the shade or wearing long sleeves, sun glasses (UVA+UVB protection) and wide brim hats (4-inch brim around the entire circumference of the hat) are also recommended for sun protection.    Due to recent changes in healthcare laws, you may see results of your pathology and/or laboratory studies on MyChart before the doctors have had a chance to review them. We  understand that in some cases there may be results that are confusing or concerning to you. Please understand that not all results are received at the same time and often the doctors may need to interpret multiple results in order to provide you with the best plan of care or course of treatment. Therefore, we ask that you please give us  2 business days to thoroughly review all your results before contacting the office for clarification. Should we see a critical lab result, you will be contacted sooner.   If You Need Anything After Your Visit  If you have any questions or concerns for your doctor, please call our main line at [phone] and press option 4 to reach your doctor's medical assistant. If no one answers, please leave a voicemail as directed and we will return your call as soon as possible. Messages left after 4 pm will be answered the following business day.   You may also send us  a message via MyChart. We typically respond to MyChart messages within 1-2 business days.  For prescription refills, please ask your pharmacy to contact our office. Our fax number is [phone].  If you have an urgent issue when the clinic is closed that cannot wait until the next business day, you can page your doctor at the number below.    Please note that while we do our best to be available for urgent issues outside of office hours, we are not available 24/7.   If you have an urgent issue and are unable to reach us , you may choose to seek medical care at your doctor's office, retail clinic, urgent care center, or emergency room.  If you have a medical emergency, please immediately call 911 or go to the emergency department.  Pager Numbers  - Dr. Hester: [phone]  - Dr. Jackquline: [phone]  - Dr. Claudene: [phone]   - Dr. Raymund: [phone]  In the event of inclement weather, please call our main line at [phone] for an update on the status of any delays or  closures.  Dermatology Medication Tips: Please keep the boxes that topical medications come in in order to help keep track of the instructions about where and how to use these. Pharmacies typically print the medication instructions only on the boxes and not directly on the medication tubes.   If your medication is too expensive, please contact our office at [phone] option 4 or send us  a message through MyChart.   We are unable to tell what your co-pay for medications will be in advance as this is different depending on your insurance coverage. However, we may be able to find a substitute medication at lower cost or fill out paperwork to get insurance to cover a needed medication.   If a prior authorization is required to get your medication covered by your insurance company, please allow us  1-2 business days to complete  this process.  Drug prices often vary depending on where the prescription is filled and some pharmacies may offer cheaper prices.  The website www.goodrx.com contains coupons for medications through different pharmacies. The prices here do not account for what the cost may be with help from insurance (it may be cheaper with your insurance), but the website can give you the price if you did not use any insurance.  - You can print the associated coupon and take it with your prescription to the pharmacy.  - You may also stop by our office during regular business hours and pick up a GoodRx coupon card.  - If you need your prescription sent electronically to a different pharmacy, notify our office through [facility] or by phone at [phone] option 4.     Si Usted Necesita Algo Despus de Su Visita  Tambin puede enviarnos un mensaje a travs de Clinical cytogeneticist. Por lo general respondemos a los mensajes de MyChart en el transcurso de 1 a 2 das hbiles.  Para renovar recetas, por favor pida a su farmacia que se ponga en contacto con nuestra oficina. Randi lakes de fax  es [place] [phone].  Si tiene un asunto urgente cuando la clnica est cerrada y que no puede esperar hasta el siguiente da hbil, puede llamar/localizar a su doctor(a) al nmero que aparece a continuacin.   Por favor, tenga en cuenta que aunque hacemos todo lo posible para estar disponibles para asuntos urgentes fuera del horario de [place], no estamos disponibles las 24 horas del da, los 7 [address]  [address] de la [place].   Si tiene un problema urgente y no puede comunicarse con nosotros, puede optar por buscar atencin mdica  en el consultorio de su doctor(a), en una clnica privada, en un centro de atencin urgente o en una sala de emergencias.  Si tiene Engineer, drilling, por favor llame inmediatamente al 911 o vaya a la sala de emergencias.  Nmeros de bper  - Dr. Hester: [phone]  - Dra. Jackquline: [phone]  - Dr. Claudene: [phone]  - Dra. Kitts: [phone]  En caso de inclemencias del [place], por favor llame a nuestra lnea principal al [phone] para una actualizacin sobre el estado de cualquier retraso o cierre.  Consejos para la medicacin en dermatologa: Por favor, guarde las cajas en las que vienen los medicamentos de uso tpico para ayudarle a seguir las instrucciones sobre dnde y cmo usarlos. Las farmacias generalmente imprimen las instrucciones del medicamento slo en las cajas y no directamente en los tubos del [place].   Si su medicamento es muy caro, por favor, pngase en contacto con landry rieger llamando al [phone] y presione la opcin 4 o envenos un mensaje a travs de Clinical cytogeneticist.   No podemos decirle cul ser su copago por los medicamentos por adelantado ya que esto es diferente dependiendo de la cobertura de su seguro. Sin embargo, es posible que podamos encontrar un medicamento sustituto a Audiological scientist un formulario para que el seguro cubra el medicamento que se considera necesario.   Si se requiere una autorizacin previa para  que su compaa de seguros malta su medicamento, por favor permtanos de 1 a 2 das hbiles para completar este proceso.  Los precios de los medicamentos varan con frecuencia dependiendo del Environmental consultant de dnde se surte la receta y alguna farmacias pueden ofrecer precios ms baratos.  El sitio web www.goodrx.com tiene cupones para medicamentos de Health and safety inspector. Los precios aqu no tienen en cuenta lo que podra costar  con la ayuda del seguro (puede ser ms barato con su seguro), pero el sitio web puede darle el precio si no Visual merchandiser.  - Puede imprimir el cupn correspondiente y llevarlo con su receta a la farmacia.  - Tambin puede pasar por nuestra oficina durante el horario de atencin regular y Education officer, museum una tarjeta de cupones de GoodRx.  - Si necesita que su receta se enve electrnicamente a una farmacia diferente, informe a nuestra oficina a travs de MyChart de [place] o por telfono llamando al [phone] y presione la opcin 4.

## 2024-04-18 NOTE — Progress Notes (Signed)
 Follow-Up Visit   Subjective  Jaime Ramirez is a 73 y.o. female who presents for the following: Skin Cancer Screening and Full Body Skin Exam Hx of psoriasis flares at feet and elbows, has used otc urea cream and has opzelura  1.5 % cream. States she has been using but feels it is not working. Patient has used Zoryve  cream in the past and would like refills. . Has improved Hx of bcc, hx of isks    Patient reports some areas at left arm and right hand   The patient presents for Total-Body Skin Exam (TBSE) for skin cancer screening and mole check. The patient has spots, moles and lesions to be evaluated, some may be new or changing and the patient may have concern these could be cancer.  The following portions of the chart were reviewed this encounter and updated as appropriate: medications, allergies, medical history  Review of Systems:  No other skin or systemic complaints except as noted in HPI or Assessment and Plan.  Objective  Well appearing patient in no apparent distress; mood and affect are within normal limits.  A full examination was performed including scalp, head, eyes, ears, nose, lips, neck, chest, axillae, abdomen, back, buttocks, bilateral upper extremities, bilateral lower extremities, hands, feet, fingers, toes, fingernails, and toenails. All findings within normal limits unless otherwise noted below.   Relevant physical exam findings are noted in the Assessment and Plan.  Right dorsum hand at middle finger at mcp x 1 Erythematous stuck-on, waxy papule or plaque  Assessment & Plan   PSORIASIS with Atopic Dermatitis overlap  Hands and feet. Exam: Hyperkeratosis at b/l heels ; hands clear today. Chronic and persistent condition with duration or expected duration over one year. Condition is improving with treatment but not currently at goal. Patient does have joint pain in the toes, ankles, L elbow, and shoulders Will Refer to Rheumatology if pt  interested. Treatment Plan: Stop opzelura  1.5 % cream  Start Zoryve  0.3 % cream - apply twice daily to affected areas for psoriasis  Continue urea 40% cream may use QD-BID PRN.  Counseling on psoriasis and coordination of care  psoriasis is a chronic non-curable, but treatable genetic/hereditary disease that may have other systemic features affecting other organ systems such as joints (Psoriatic Arthritis). It is associated with an increased risk of inflammatory bowel disease, heart disease, non-alcoholic fatty liver disease, and depression.  Treatments include light and laser treatments; topical medications; and systemic medications including oral and injectables.  SKIN CANCER SCREENING PERFORMED TODAY.  ACTINIC DAMAGE - Chronic condition, secondary to cumulative UV/sun exposure - diffuse scaly erythematous macules with underlying dyspigmentation - Recommend daily broad spectrum sunscreen SPF 30+ to sun-exposed areas, reapply every 2 hours as needed.  - Staying in the shade or wearing long sleeves, sun glasses (UVA+UVB protection) and wide brim hats (4-inch brim around the entire circumference of the hat) are also recommended for sun protection.  - Call for new or changing lesions.  LENTIGINES, SEBORRHEIC KERATOSES, HEMANGIOMAS - Benign normal skin lesions - Benign-appearing - Call for any changes  MELANOCYTIC NEVI - Tan-brown and/or pink-flesh-colored symmetric macules and papules - Benign appearing on exam today - Observation - Call clinic for new or changing moles - Recommend daily use of broad spectrum spf 30+ sunscreen to sun-exposed areas.   Purpura - Chronic; persistent and recurrent.  Treatable, but not curable. 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  - Call if any new or changing lesions are noted between office visits   INFLAMED SEBORRHEIC KERATOSIS Right dorsum hand at middle finger at mcp x 1 Symptomatic, irritating, patient would like treated.  Discussed if not gone after 8 weeks, advised to call or send mychart  Destruction of lesion - Right dorsum hand at middle finger at mcp x 1 Complexity: simple   Destruction method: cryotherapy   Informed consent: discussed and consent obtained   Timeout:  patient name, date of birth, surgical site, and procedure verified Lesion destroyed using liquid nitrogen: Yes   Region frozen until ice ball extended beyond lesion: Yes   Outcome: patient tolerated procedure well with no complications   Post-procedure details: wound care instructions given    PSORIASIS   Related Medications Roflumilast  (ZORYVE ) 0.3 % CREA Qd to aa psoriasis on hands, left elbow and feet prn flares SKIN CANCER SCREENING   COUNSELING AND COORDINATION OF CARE   MEDICATION MANAGEMENT   ACTINIC SKIN DAMAGE   HISTORY OF BASAL CELL CARCINOMA   LENTIGO   MELANOCYTIC NEVUS, UNSPECIFIED LOCATION   PURPURA (HCC)   Return in about 1 year (around 04/18/2025) for TBSE.  IEleanor Blush, CMA, am acting as scribe for Alm Rhyme, MD.   Documentation: I have reviewed the above documentation for accuracy and completeness, and I agree with the above.  Alm Rhyme, MD

## 2024-04-19 ENCOUNTER — Telehealth: Payer: Self-pay

## 2024-04-19 NOTE — Telephone Encounter (Signed)
 Called and asked patient if she would like referral to rheumatology for evaluation of joint aches per Dr. Lionell request.  Patient has history of psoriasis. Patient was appreciative but declined referral.

## 2025-04-18 ENCOUNTER — Ambulatory Visit: Admitting: Dermatology
